# Patient Record
Sex: Female | Born: 2000
Health system: Southern US, Community
[De-identification: ages and names within clinical notes are randomized; demographics above are authoritative.]

## PROBLEM LIST (undated history)

## (undated) DIAGNOSIS — R102 Pelvic and perineal pain: Secondary | ICD-10-CM

## (undated) DIAGNOSIS — F419 Anxiety disorder, unspecified: Secondary | ICD-10-CM

## (undated) DIAGNOSIS — Z973 Presence of spectacles and contact lenses: Secondary | ICD-10-CM

## (undated) DIAGNOSIS — L709 Acne, unspecified: Secondary | ICD-10-CM

## (undated) HISTORY — DX: Acne, unspecified: L70.9

---
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
## 2021-01-10 ENCOUNTER — Other Ambulatory Visit (HOSPITAL_COMMUNITY): Payer: Self-pay

## 2021-01-11 ENCOUNTER — Other Ambulatory Visit (HOSPITAL_COMMUNITY): Payer: Self-pay

## 2021-01-11 DIAGNOSIS — L239 Allergic contact dermatitis, unspecified cause: Secondary | ICD-10-CM | POA: Diagnosis not present

## 2021-01-11 DIAGNOSIS — Z0000 Encounter for general adult medical examination without abnormal findings: Secondary | ICD-10-CM | POA: Diagnosis not present

## 2021-01-11 DIAGNOSIS — L700 Acne vulgaris: Secondary | ICD-10-CM | POA: Diagnosis not present

## 2021-01-11 DIAGNOSIS — F419 Anxiety disorder, unspecified: Secondary | ICD-10-CM | POA: Diagnosis not present

## 2021-01-11 DIAGNOSIS — Z23 Encounter for immunization: Secondary | ICD-10-CM | POA: Diagnosis not present

## 2021-01-11 MED ORDER — ESCITALOPRAM OXALATE 10 MG PO TABS
10.0000 mg | ORAL_TABLET | Freq: Every day | ORAL | 1 refills | Status: DC
  Filled 2021-01-11: qty 30, 30d supply, fill #0
  Filled 2021-01-21: qty 30, 30d supply, fill #1

## 2021-01-12 DIAGNOSIS — Z130 Encounter for screening for diseases of the blood and blood-forming organs and certain disorders involving the immune mechanism: Secondary | ICD-10-CM | POA: Diagnosis not present

## 2021-01-16 DIAGNOSIS — F4322 Adjustment disorder with anxiety: Secondary | ICD-10-CM | POA: Diagnosis not present

## 2021-01-22 ENCOUNTER — Other Ambulatory Visit (HOSPITAL_COMMUNITY): Payer: Self-pay

## 2021-01-22 MED ORDER — ESCITALOPRAM OXALATE 10 MG PO TABS
10.0000 mg | ORAL_TABLET | Freq: Every day | ORAL | 1 refills | Status: DC
  Filled 2021-01-22: qty 30, 30d supply, fill #0

## 2021-01-23 ENCOUNTER — Other Ambulatory Visit (HOSPITAL_COMMUNITY): Payer: Self-pay

## 2021-01-30 ENCOUNTER — Other Ambulatory Visit (HOSPITAL_COMMUNITY): Payer: Self-pay

## 2021-03-01 ENCOUNTER — Encounter (HOSPITAL_BASED_OUTPATIENT_CLINIC_OR_DEPARTMENT_OTHER): Payer: Self-pay

## 2021-03-02 DIAGNOSIS — R1900 Intra-abdominal and pelvic swelling, mass and lump, unspecified site: Secondary | ICD-10-CM | POA: Diagnosis not present

## 2021-03-02 DIAGNOSIS — R1904 Left lower quadrant abdominal swelling, mass and lump: Secondary | ICD-10-CM | POA: Diagnosis not present

## 2021-03-05 ENCOUNTER — Other Ambulatory Visit (HOSPITAL_BASED_OUTPATIENT_CLINIC_OR_DEPARTMENT_OTHER): Payer: Self-pay | Admitting: Obstetrics & Gynecology

## 2021-03-05 ENCOUNTER — Other Ambulatory Visit (HOSPITAL_COMMUNITY): Payer: Self-pay

## 2021-03-05 DIAGNOSIS — R1031 Right lower quadrant pain: Secondary | ICD-10-CM

## 2021-03-05 MED ORDER — LO LOESTRIN FE 1 MG-10 MCG / 10 MCG PO TABS: 1.0000 | ORAL_TABLET | Freq: Every day | ORAL | 9 refills | Status: DC

## 2021-03-06 ENCOUNTER — Other Ambulatory Visit (HOSPITAL_COMMUNITY): Payer: Self-pay

## 2021-03-06 ENCOUNTER — Other Ambulatory Visit (HOSPITAL_BASED_OUTPATIENT_CLINIC_OR_DEPARTMENT_OTHER): Payer: Self-pay | Admitting: Obstetrics & Gynecology

## 2021-03-06 DIAGNOSIS — R1031 Right lower quadrant pain: Secondary | ICD-10-CM

## 2021-03-06 MED ORDER — NORETHIN-ETH ESTRAD-FE BIPHAS 1 MG-10 MCG / 10 MCG PO TABS
1.0000 | ORAL_TABLET | Freq: Every day | ORAL | 2 refills | Status: DC
  Filled 2021-03-06: qty 84, 84d supply, fill #0

## 2021-03-07 ENCOUNTER — Other Ambulatory Visit (HOSPITAL_COMMUNITY): Payer: Self-pay

## 2021-03-07 ENCOUNTER — Encounter (HOSPITAL_BASED_OUTPATIENT_CLINIC_OR_DEPARTMENT_OTHER): Payer: Self-pay

## 2021-03-07 DIAGNOSIS — F339 Major depressive disorder, recurrent, unspecified: Secondary | ICD-10-CM | POA: Insufficient documentation

## 2021-03-07 MED ORDER — ESCITALOPRAM OXALATE 10 MG PO TABS
10.0000 mg | ORAL_TABLET | Freq: Every day | ORAL | 1 refills | Status: DC
  Filled 2021-03-07: qty 90, 90d supply, fill #0

## 2021-03-08 ENCOUNTER — Other Ambulatory Visit (HOSPITAL_BASED_OUTPATIENT_CLINIC_OR_DEPARTMENT_OTHER): Payer: Self-pay | Admitting: Obstetrics & Gynecology

## 2021-03-08 DIAGNOSIS — R1032 Left lower quadrant pain: Secondary | ICD-10-CM

## 2021-03-08 NOTE — Progress Notes (Signed)
Pelvic MRI order placed.

## 2021-03-14 NOTE — Addendum Note (Signed)
Addended by: Blenda Nicely on: 03/14/2021 03:51 PM   Modules accepted: Orders

## 2021-03-15 ENCOUNTER — Encounter (HOSPITAL_BASED_OUTPATIENT_CLINIC_OR_DEPARTMENT_OTHER): Payer: Self-pay | Admitting: *Deleted

## 2021-03-21 ENCOUNTER — Ambulatory Visit (HOSPITAL_COMMUNITY)
Admission: RE | Admit: 2021-03-21 | Discharge: 2021-03-21 | Disposition: A | Discharge status: Home / Self Care | Payer: 59 | Source: Ambulatory Visit | Attending: Obstetrics & Gynecology | Admitting: Obstetrics & Gynecology

## 2021-03-21 ENCOUNTER — Other Ambulatory Visit: Payer: Self-pay

## 2021-03-21 DIAGNOSIS — R1032 Left lower quadrant pain: Secondary | ICD-10-CM | POA: Diagnosis not present

## 2021-03-21 DIAGNOSIS — N83202 Unspecified ovarian cyst, left side: Secondary | ICD-10-CM | POA: Diagnosis not present

## 2021-03-21 IMAGING — MR MR PELVIS WO/W CM
25 of 53 series · 25 of 53 positions shown · IV contrast (Contrast agent)
Comparison: None.

CLINICAL DATA: Left lower quadrant pain.

EXAM:
MRI PELVIS WITHOUT AND WITH CONTRAST
TECHNIQUE: Multiplanar multisequence MR imaging of the pelvis was performed
both before and after administration of intravenous contrast.
CONTRAST:  7.5mL MULTIHANCE GADOBENATE DIMEGLUMINE 529 MG/ML IV SOLN

[Series 2: T2 · coronal · 5.0mm · 1.41mm/px · 1 of 32 slices shown (1 of 2)]
[im 1/32]
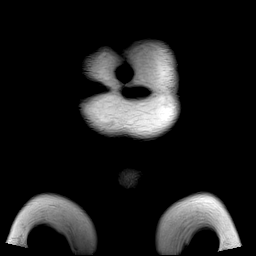

[Series 3: ax tse trig · axial · 5.0mm · 0.81mm/px · 1 of 37 slices shown]
[im 1/37]
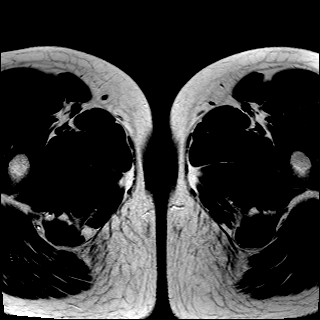

[Series 4: ax tse fs · axial · 5.0mm · 0.81mm/px · 1 of 37 slices shown]
[im 1/37]
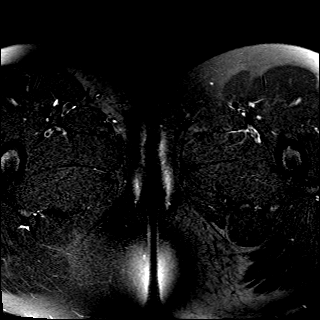

[Series 5: sag tse trig · sagittal · 5.0mm · 0.75mm/px · 1 of 40 slices shown]
[im 1/40]
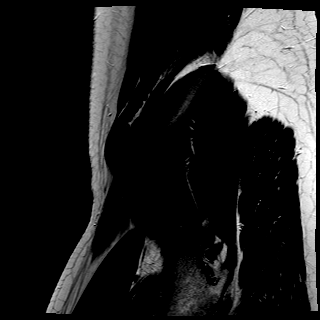

[Series 6: T1 · axial · 5.0mm · 0.74mm/px · 1 of 37 slices shown]
[im 1/37]
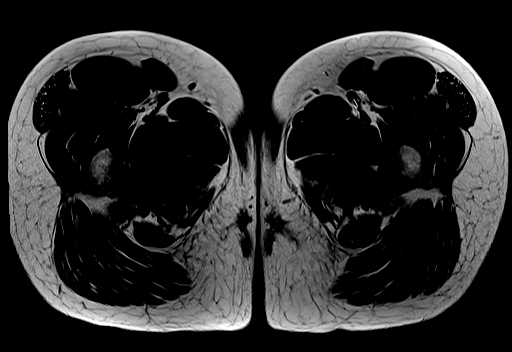

[Series 7: T1 dynamic fat-sat · axial · 1.4mm · 0.59mm/px · 1 of 160 slices shown (1 of 2)]
[im 1/160]
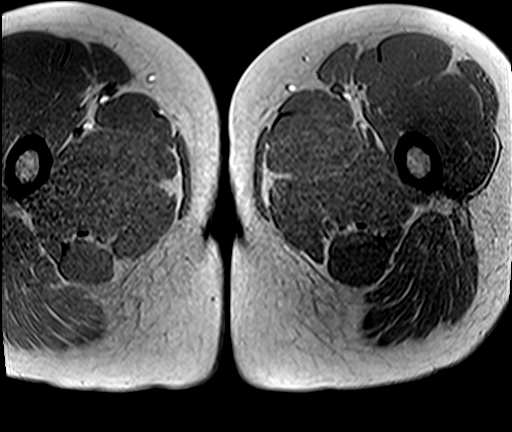

[Series 7: T1 dynamic fat-sat · axial · 1.4mm · 0.59mm/px · 1 of 160 slices shown (2 of 2)]
[im 1/160]
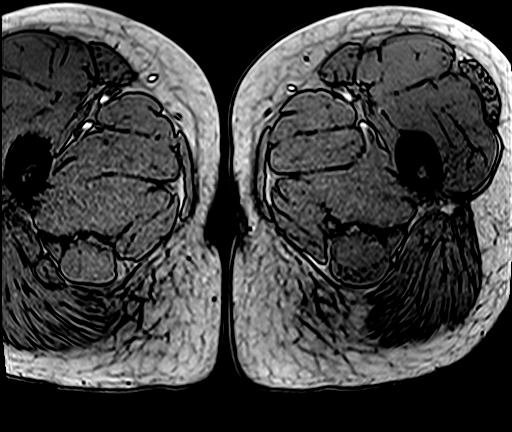

[Series 8: T2 · coronal · 4.0mm · 0.59mm/px · 1 of 38 slices shown (2 of 2)]
[im 1/38]
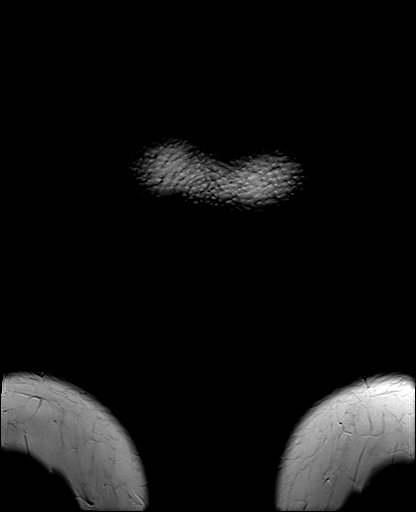

[Series 9: T1 dynamic · axial · 3.9mm · 0.55mm/px · 1 of 56 slices shown (1 of 17)]
[im 1/56]
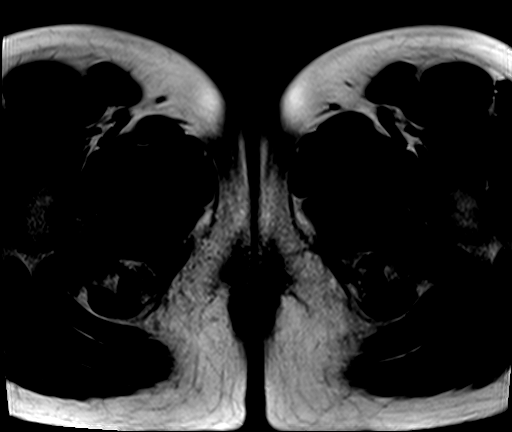

[Series 10: T1 dynamic · axial · 3.9mm · 0.55mm/px · 1 of 56 slices shown (2 of 17)]
[im 1/56]
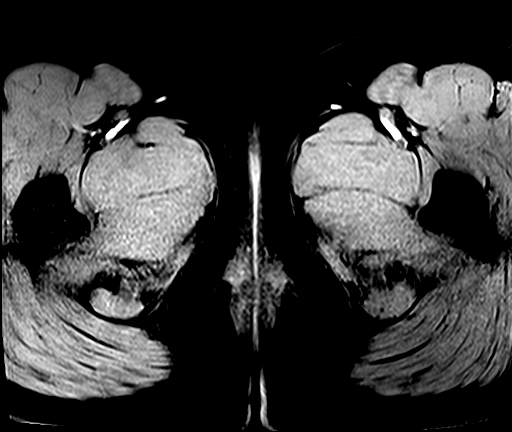

[Series 11: T1 dynamic · axial · 3.9mm · 0.55mm/px · 1 of 56 slices shown (3 of 17)]
[im 1/56]
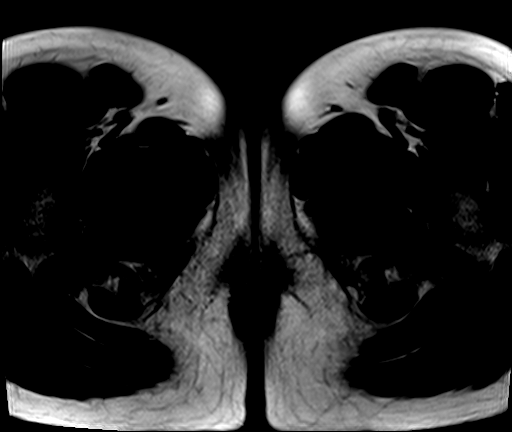

[Series 12: T1 dynamic · axial · 3.9mm · 0.55mm/px · 1 of 56 slices shown (4 of 17)]
[im 1/56]
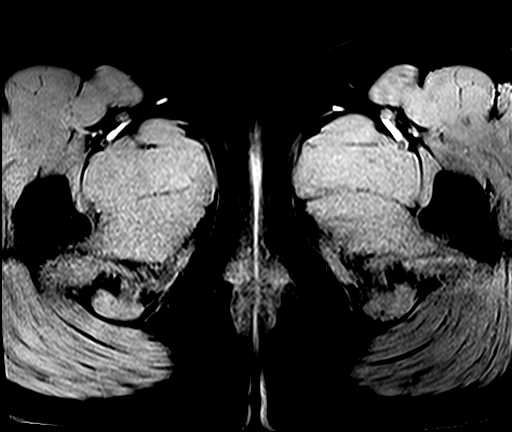

[Series 13: T1 dynamic · axial · 3.9mm · 0.55mm/px · 1 of 56 slices shown (5 of 17)]
[im 1/56]
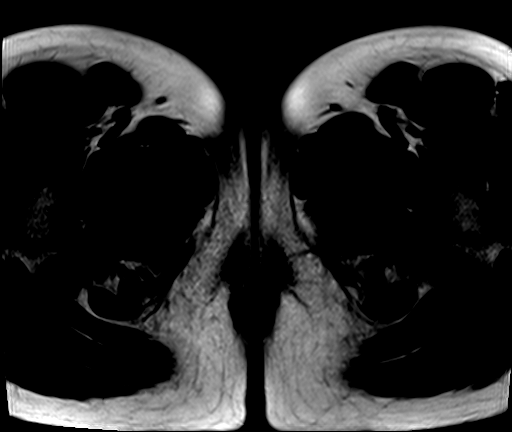

[Series 14: T1 dynamic · axial · 3.9mm · 0.55mm/px · 1 of 56 slices shown (6 of 17)]
[im 1/56]
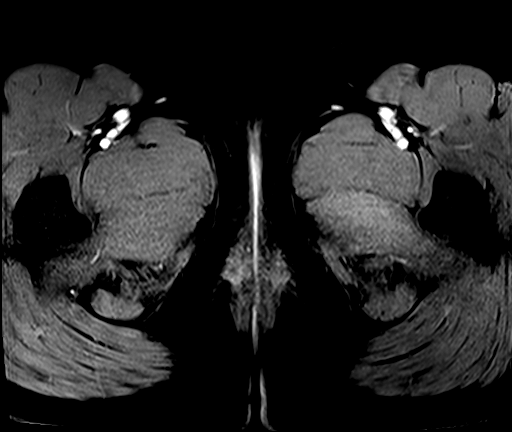

[Series 15: T1 dynamic · axial · 3.9mm · 0.55mm/px · 1 of 56 slices shown (7 of 17)]
[im 1/56]
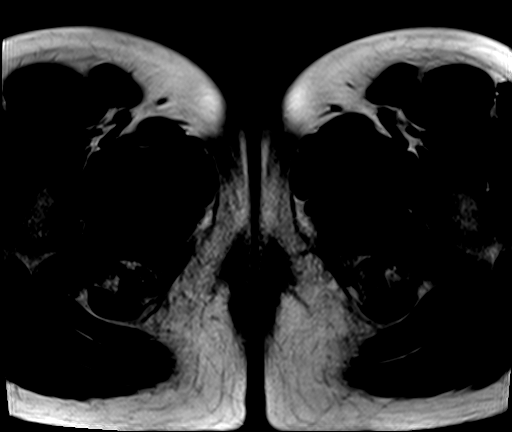

[Series 16: T1 dynamic · axial · 3.9mm · 0.55mm/px · 1 of 56 slices shown (8 of 17)]
[im 1/56]
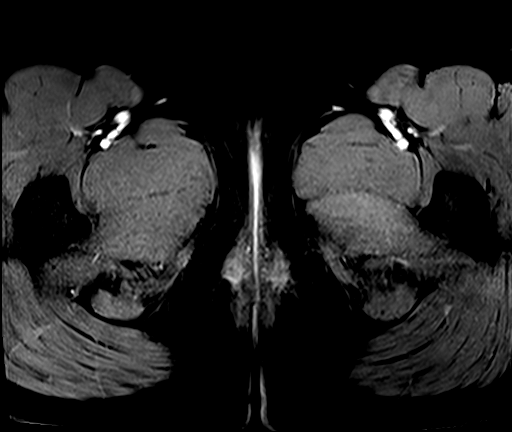

[Series 17: T1 dynamic · axial · 3.9mm · 0.55mm/px · 1 of 56 slices shown (9 of 17)]
[im 1/56]
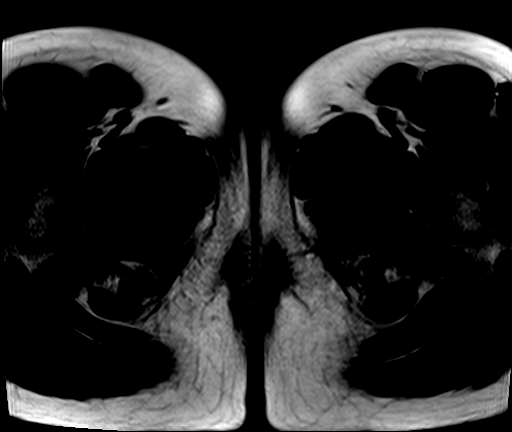

[Series 18: T1 dynamic · axial · 3.9mm · 0.55mm/px · 1 of 56 slices shown (10 of 17)]
[im 1/56]
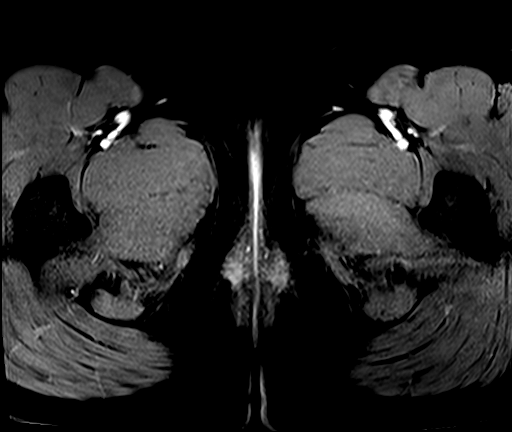

[Series 19: T1 dynamic · axial · 3.9mm · 0.55mm/px · 1 of 56 slices shown (11 of 17)]
[im 1/56]
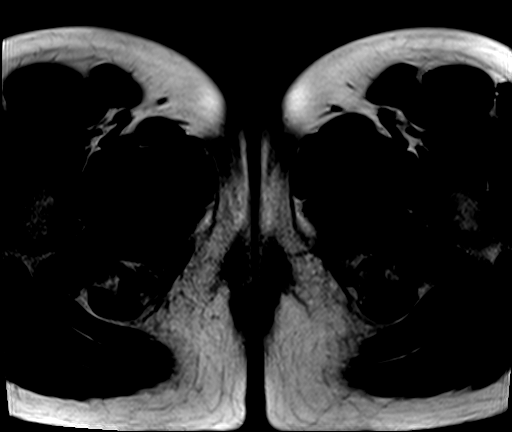

[Series 20: T1 dynamic · axial · 3.9mm · 0.55mm/px · 1 of 56 slices shown (12 of 17)]
[im 1/56]
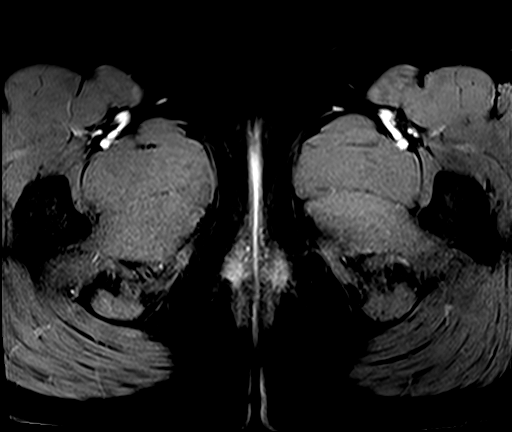

[Series 21: T1 dynamic · axial · 3.9mm · 0.55mm/px · 1 of 56 slices shown (13 of 17)]
[im 1/56]
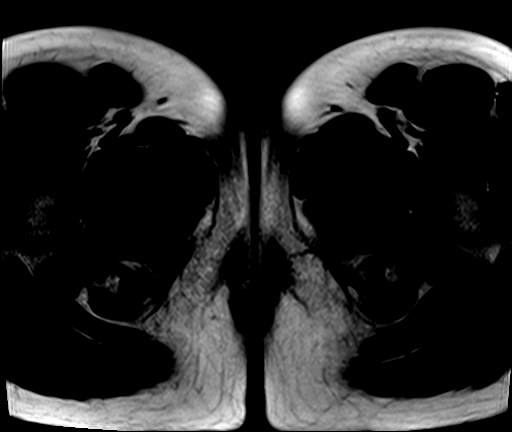

[Series 22: T1 dynamic · axial · 3.9mm · 0.55mm/px · 1 of 56 slices shown (14 of 17)]
[im 1/56]
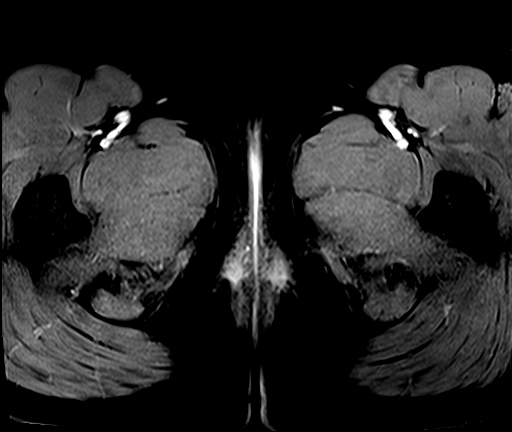

[Series 23: T1 dynamic · axial · 3.9mm · 0.55mm/px · 1 of 56 slices shown (15 of 17)]
[im 1/56]
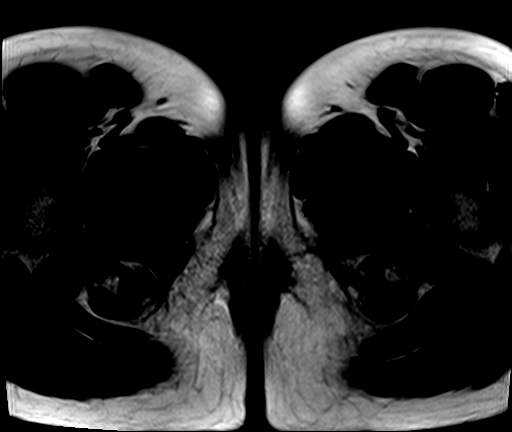

[Series 24: T1 dynamic · axial · 3.9mm · 0.55mm/px · 1 of 56 slices shown (16 of 17)]
[im 1/56]
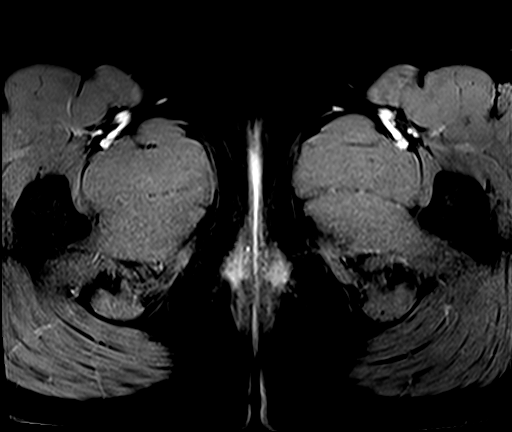

[Series 25: T1 dynamic · axial · 3.9mm · 0.55mm/px · 1 of 56 slices shown (17 of 17)]
[im 1/56]
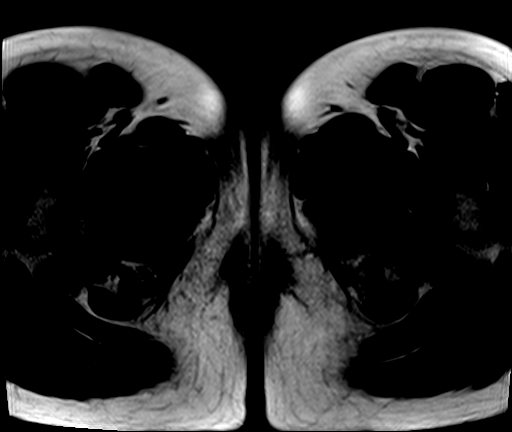

[25 of 53 positions shown; findings below may reference images not displayed]

FINDINGS: Lower Urinary Tract: No urinary bladder or urethral abnormality
identified.

Bowel: Unremarkable pelvic bowel loops.

Vascular/Lymphatic: Unremarkable. No pathologically enlarged pelvic
lymph nodes identified.

Reproductive:

-- Uterus: Measures 7.1 x 3.4 by 4.6 cm (volume = 58 cm^3).
Retroflexed. No fibroids or other masses identified. Cervix and
vagina are unremarkable.

-- Right ovary: Appears normal. No ovarian or adnexal masses
identified.

-- Left ovary: 2.6 cm simple, benign appearing left ovarian cyst. No
ovarian or adnexal masses identified.

Other: Tiny amount of free fluid in pelvic cul-de-sac. No evidence
of mass in the visualized portion of the abdominal wall soft
tissues.

Musculoskeletal:  Unremarkable.
IMPRESSION: 2.6 cm left ovarian simple-appearing cyst, most likely physiologic
in a reproductive age female. No follow-up imaging is recommended.
Reference: JACR [DATE]):[id number]

No other significant abnormality identified.

## 2021-03-21 MED ORDER — GADOBENATE DIMEGLUMINE 529 MG/ML IV SOLN
7.5000 mL | Freq: Once | INTRAVENOUS | Status: AC | PRN
  Administered 2021-03-21: 7.5 mL via INTRAVENOUS

## 2021-03-23 ENCOUNTER — Encounter (HOSPITAL_BASED_OUTPATIENT_CLINIC_OR_DEPARTMENT_OTHER): Payer: Self-pay

## 2021-05-01 ENCOUNTER — Other Ambulatory Visit (HOSPITAL_COMMUNITY): Payer: Self-pay

## 2021-05-01 MED ORDER — ESCITALOPRAM OXALATE 10 MG PO TABS
ORAL_TABLET | ORAL | 0 refills | Status: DC
  Filled 2021-05-07: qty 45, 30d supply, fill #0

## 2021-05-02 ENCOUNTER — Other Ambulatory Visit (HOSPITAL_COMMUNITY): Payer: Self-pay

## 2021-05-07 ENCOUNTER — Other Ambulatory Visit (HOSPITAL_COMMUNITY): Payer: Self-pay

## 2021-05-09 ENCOUNTER — Encounter (HOSPITAL_BASED_OUTPATIENT_CLINIC_OR_DEPARTMENT_OTHER): Payer: Self-pay | Admitting: Obstetrics & Gynecology

## 2021-05-09 ENCOUNTER — Other Ambulatory Visit: Payer: Self-pay

## 2021-05-09 ENCOUNTER — Ambulatory Visit (HOSPITAL_BASED_OUTPATIENT_CLINIC_OR_DEPARTMENT_OTHER): Payer: 59 | Admitting: Obstetrics & Gynecology

## 2021-05-09 VITALS — BP 117/75 | HR 65 | Ht 65.0 in | Wt 164.2 lb

## 2021-05-09 DIAGNOSIS — R1032 Left lower quadrant pain: Secondary | ICD-10-CM | POA: Diagnosis not present

## 2021-05-09 DIAGNOSIS — N926 Irregular menstruation, unspecified: Secondary | ICD-10-CM | POA: Diagnosis not present

## 2021-05-09 NOTE — Progress Notes (Signed)
GYNECOLOGY  VISIT  CC:   follow up after starting OCPs  HPI: 20 y.o. G0P0000 Single White or Caucasian female here for recheck after starting OCPs.  She reports she feels really good with this current pill.  However, she is having a three day cycle every two week.  She is not missing pills.  Bleeding has improved but is not only with placebo pills.  Denies new headaches, nausea, visual changes.  She continues to have LLQ pain.  Worse with certain activities.  Does bother her around her cycle as well.  Has lipoma remove with Dr. Donne Hazel.    Not SA.  Pt's mother accompanies her today.  Patient Active Problem List   Diagnosis Date Noted   LLQ pain 11/02/2020   Acne    Neck pain 08/17/2019   Reactive lymphadenopathy 05/28/2019   Abnormal thyroid function test 02/05/2018   Fatigue 02/05/2018    Past Medical History:  Diagnosis Date   Acne     Past Surgical History:  Procedure Laterality Date   MASS EXCISION N/A 08/03/2020   Procedure: EXCISION ABDOMINAL WALL MASS;  Surgeon: Rolm Bookbinder, MD;  Location: [facility];  Service: General;  Laterality: N/A;    MEDS:   Current Outpatient Medications on File Prior to Visit  Medication Sig Dispense Refill   Cholecalciferol (VITAMIN D3) 50 MCG (2000 UT) CAPS Take by mouth.     COVID-19 At Home Antigen Test [facility] COVID-19 HOME TEST) KIT Use as directed 4 each 0   escitalopram (LEXAPRO) 10 MG tablet Take 1 and 1/2 tablet by mouth once daily. 45 tablet 0   Multiple Vitamin (MULTIVITAMIN) tablet Take 1 tablet by mouth daily.     Norethindrone-Ethinyl Estradiol-Fe Biphas (LO LOESTRIN FE) 1 MG-10 MCG / 10 MCG tablet Take 1 tablet by mouth daily. 28 tablet 9   baclofen (LIORESAL) 10 MG tablet Take one tablet by mouth twice daily as needed. Limit 1-2 treatments per week (Patient not taking: Reported on 05/09/2021) 10 tablet 0   zonisamide (ZONEGRAN) 25 MG capsule Take 4 capsules by mouth each night (Patient not taking:  Reported on 05/09/2021) 120 capsule 1   No current facility-administered medications on file prior to visit.    ALLERGIES: Patient has no known allergies.  Family History  Problem Relation Age of Onset   Endometriosis Mother    Thyroid disease Maternal Grandmother    Heart disease Paternal Grandfather    Thyroid disease Other     SH:  single, non smoker  Review of Systems  Constitutional: Negative.   Gastrointestinal:  Positive for abdominal pain (LLQ pain).   PHYSICAL EXAMINATION:    BP 117/75 (BP Location: Right Arm, Patient Position: Sitting, Cuff Size: Large)   Pulse 65   Ht _0  (1.651 m) Comment: reported  Wt 164 lb 3.2 oz (74.5 kg)   BMI 27.32 kg/m     General appearance: alert, cooperative and appears stated age Abdomen: soft, superficial tenderness to palpation right along area of prior incision; bowel sounds normal; no masses,  no organomegaly Lymph:  no inguinal LAD noted  Assessment/Plan: 1. LLQ pain - pain is so superficial, I do think scar tissue may be part of her issue.  D/w pt and mother PT, scar massage to help.  Does not need referral as mother has contacts within [facility] for this is pt agrees. - we did discuss possible laparoscopy for future evaluation.  As pain is so very focal and superficial, feel this can  wait at this time  2. Irregular periods/menstrual cycles - has experienced side effects on 75mg dosage.  We discussed nuva ring.  Pt has no interest in this.  If irregular bleeding continues, may need to explore other 247m dosage OCPs again.  Of note, this does not seem to have made any difference with LLQ pain which also decreases possibility that pain is caused by endometriosis - she will let me know if desires to switch pill after completion of current pack  Total time with pt and documentation:  24 minutes

## 2021-05-17 ENCOUNTER — Other Ambulatory Visit (HOSPITAL_COMMUNITY): Payer: Self-pay

## 2021-05-17 ENCOUNTER — Encounter (HOSPITAL_BASED_OUTPATIENT_CLINIC_OR_DEPARTMENT_OTHER): Payer: Self-pay | Admitting: Obstetrics & Gynecology

## 2021-05-17 ENCOUNTER — Other Ambulatory Visit (HOSPITAL_BASED_OUTPATIENT_CLINIC_OR_DEPARTMENT_OTHER): Payer: Self-pay | Admitting: Obstetrics & Gynecology

## 2021-05-17 MED ORDER — LO LOESTRIN FE 1 MG-10 MCG / 10 MCG PO TABS
1.0000 | ORAL_TABLET | Freq: Every day | ORAL | 2 refills | Status: DC
  Filled 2021-05-17: qty 84, 84d supply, fill #0

## 2021-05-29 DIAGNOSIS — F4322 Adjustment disorder with anxiety: Secondary | ICD-10-CM | POA: Diagnosis not present

## 2021-05-30 DIAGNOSIS — F4322 Adjustment disorder with anxiety: Secondary | ICD-10-CM | POA: Diagnosis not present

## 2021-06-03 ENCOUNTER — Encounter (HOSPITAL_BASED_OUTPATIENT_CLINIC_OR_DEPARTMENT_OTHER): Payer: Self-pay | Admitting: Obstetrics & Gynecology

## 2021-06-06 ENCOUNTER — Other Ambulatory Visit: Payer: Self-pay

## 2021-06-06 ENCOUNTER — Other Ambulatory Visit (HOSPITAL_COMMUNITY)
Admission: RE | Admit: 2021-06-06 | Discharge: 2021-06-06 | Disposition: A | Discharge status: Home / Self Care | Payer: 59 | Source: Ambulatory Visit | Attending: Obstetrics & Gynecology | Admitting: Obstetrics & Gynecology

## 2021-06-06 ENCOUNTER — Encounter (HOSPITAL_BASED_OUTPATIENT_CLINIC_OR_DEPARTMENT_OTHER): Payer: Self-pay | Admitting: Obstetrics & Gynecology

## 2021-06-06 ENCOUNTER — Ambulatory Visit (INDEPENDENT_AMBULATORY_CARE_PROVIDER_SITE_OTHER): Payer: 59 | Admitting: Obstetrics & Gynecology

## 2021-06-06 VITALS — BP 113/73 | HR 65 | Ht 65.0 in | Wt 163.8 lb

## 2021-06-06 DIAGNOSIS — Z0000 Encounter for general adult medical examination without abnormal findings: Secondary | ICD-10-CM | POA: Diagnosis not present

## 2021-06-06 DIAGNOSIS — R5383 Other fatigue: Secondary | ICD-10-CM

## 2021-06-06 DIAGNOSIS — M6289 Other specified disorders of muscle: Secondary | ICD-10-CM

## 2021-06-06 DIAGNOSIS — N898 Other specified noninflammatory disorders of vagina: Secondary | ICD-10-CM

## 2021-06-06 DIAGNOSIS — N926 Irregular menstruation, unspecified: Secondary | ICD-10-CM | POA: Diagnosis not present

## 2021-06-06 DIAGNOSIS — Z01419 Encounter for gynecological examination (general) (routine) without abnormal findings: Secondary | ICD-10-CM | POA: Diagnosis not present

## 2021-06-06 DIAGNOSIS — R1032 Left lower quadrant pain: Secondary | ICD-10-CM

## 2021-06-06 NOTE — Progress Notes (Addendum)
20 y.o. G0P0000 Single White or Caucasian female here for annual exam.  She does have a boyfriend and does have pain with digital insertion.  She is not currently SA.   ° °Does continue to have LLQ pain.  Has done some dry needling.  This did not help. ° °We continue to discuss possibility of laparoscopy for more evaluation of endometriosis.  She and mother are considering.   ° °On OCPs.  Does have some irregular spotting.  Pt doing well with hormonal dosing.  Desires to continue. °         °Sexually active: not having intercourse °The current method of family planning is OCP (estrogen/progesterone).    °Exercising: Yes.    Plays field hockey in college °Smoker:  no ° ° reports that she has never smoked. She has never used smokeless tobacco. She reports that she does not drink alcohol and does not use drugs. ° °Past Medical History:  °Diagnosis Date  ° Acne   ° ° °Past Surgical History:  °Procedure Laterality Date  ° MASS EXCISION N/A 08/03/2020  ° Procedure: EXCISION ABDOMINAL WALL MASS;  Surgeon: Wakefield, Matthew, MD;  Location: [facility];  Service: General;  Laterality: N/A;  ° ° °Current Outpatient Medications  °Medication Sig Dispense Refill  ° Cholecalciferol (VITAMIN D3) 50 MCG (2000 UT) CAPS Take by mouth.    ° COVID-19 At Home Antigen Test (CARESTART COVID-19 HOME TEST) KIT Use as directed 4 each 0  ° escitalopram (LEXAPRO) 10 MG tablet Take 1 and 1/2 tablet by mouth once daily. 45 tablet 0  ° Multiple Vitamin (MULTIVITAMIN) tablet Take 1 tablet by mouth daily.    ° Norethindrone-Ethinyl Estradiol-Fe Biphas (LO LOESTRIN FE) 1 MG-10 MCG / 10 MCG tablet Take 1 tablet by mouth daily. 84 tablet 2  ° baclofen (LIORESAL) 10 MG tablet Take one tablet by mouth twice daily as needed. Limit 1-2 treatments per week (Patient not taking: Reported on 05/09/2021) 10 tablet 0  ° zonisamide (ZONEGRAN) 25 MG capsule Take 4 capsules by mouth each night (Patient not taking: Reported on 05/09/2021) 120 capsule  1  ° °No current facility-administered medications for this visit.  ° ° °Family History  °Problem Relation Age of Onset  ° Endometriosis Mother   ° Thyroid disease Maternal Grandmother   ° Heart disease Paternal Grandfather   ° Thyroid disease Other   ° ° °Review of Systems  °Genitourinary:   °     LLQ pain  ° °Exam:   °BP 113/73 (BP Location: Right Arm, Patient Position: Sitting, Cuff Size: Large)    Pulse 65    Ht 5' 5" (1.651 m) Comment: reported   Wt 163 lb 12.8 oz (74.3 kg)    LMP 05/21/2021 Comment: A little bit of bleeding   BMI 27.26 kg/m²   Height: 5' 5" (165.1 cm) (reported) ° °General appearance: alert, cooperative and appears stated age °Head: Normocephalic, without obvious abnormality, atraumatic °Abdomen: soft, mild superficial LLQ pain; bowel sounds normal; no masses,  no organomegaly °Extremities: extremities normal, atraumatic, no cyanosis or edema °Skin: Skin color, texture, turgor normal. No rashes or lesions °Lymph nodes: Cervical, supraclavicular, and axillary nodes normal. °No abnormal inguinal nodes palpated °Neurologic: Grossly normal ° °Pelvic: External genitalia:  erythema at introitus esp at 7 o'clock °             Urethra:  normal appearing urethra with no masses, tenderness or lesions °               Bartholins and Skenes: normal    °             Vagina: normal appearing vagina with normal color and no discharge, no lesions °             Cervix: no lesions °             Pap taken: No. °Bimanual Exam:  Uterus:  normal size, contour, position, consistency, mobility, non-tender °             Adnexa:  tenderness at left adnexa to deeper palpation °             Tenderness to palpation of pelvic floor ° °Chaperone, Tonya , CMA, was present for exam. ° °Assessment/Plan: °1. Well woman exam with routine gynecological exam °- pap not indicated.  As has not had intercourse, GC/Chl screening not indicated °- will start MMG/colon cancer screening later according to guidelines ° °2. Vaginal  irritation °- Cervicovaginal ancillary only( [place]) °- negative will start topical vaginal estrogen cream ° °3. Other fatigue °- CBC °- TSH °- VITAMIN D 25 Hydroxy (Vit-D Deficiency, Fractures) °- Comprehensive metabolic panel °- Ferritin ° °4. LLQ pain °- will proceed with laparoscopy when pt is ready ° °5. Irregular periods/menstrual cycles °- improved with Loloestrin.  Does not need rx at this time. ° ° °

## 2021-06-07 ENCOUNTER — Encounter: Payer: 59 | Attending: Obstetrics & Gynecology | Admitting: Physical Therapy

## 2021-06-07 ENCOUNTER — Encounter (HOSPITAL_BASED_OUTPATIENT_CLINIC_OR_DEPARTMENT_OTHER): Payer: Self-pay | Admitting: Obstetrics & Gynecology

## 2021-06-07 ENCOUNTER — Encounter: Payer: Self-pay | Admitting: Physical Therapy

## 2021-06-07 DIAGNOSIS — M6289 Other specified disorders of muscle: Secondary | ICD-10-CM | POA: Diagnosis not present

## 2021-06-07 DIAGNOSIS — R252 Cramp and spasm: Secondary | ICD-10-CM | POA: Insufficient documentation

## 2021-06-07 DIAGNOSIS — M6281 Muscle weakness (generalized): Secondary | ICD-10-CM | POA: Diagnosis not present

## 2021-06-07 LAB — COMPREHENSIVE METABOLIC PANEL
ALT: 14 IU/L (ref 0–32)
AST: 19 IU/L (ref 0–40)
Albumin/Globulin Ratio: 1.7 (ref 1.2–2.2)
Albumin: 4.7 g/dL (ref 3.9–5.0)
Alkaline Phosphatase: 66 IU/L (ref 42–106)
BUN/Creatinine Ratio: 16 (ref 9–23)
BUN: 12 mg/dL (ref 6–20)
Bilirubin Total: <0.2 mg/dL (ref 0.0–1.2)
CO2: 21 mmol/L (ref 20–29)
Calcium: 9.5 mg/dL (ref 8.7–10.2)
Chloride: 102 mmol/L (ref 96–106)
Creatinine, Ser: 0.74 mg/dL (ref 0.57–1.00)
Globulin, Total: 2.7 g/dL (ref 1.5–4.5)
Glucose: 80 mg/dL (ref 70–99)
Potassium: 4.2 mmol/L (ref 3.5–5.2)
Sodium: 139 mmol/L (ref 134–144)
Total Protein: 7.4 g/dL (ref 6.0–8.5)
eGFR: 119 mL/min/{1.73_m2} (ref >59)

## 2021-06-07 LAB — CBC
Hematocrit: 40.3 % (ref 34.0–46.6)
Hemoglobin: 13.4 g/dL (ref 11.1–15.9)
MCH: 27.5 pg (ref 26.6–33.0)
MCHC: 33.3 g/dL (ref 31.5–35.7)
MCV: 83 fL (ref 79–97)
Platelets: 301 10*3/uL (ref 150–450)
RBC: 4.88 x10E6/uL (ref 3.77–5.28)
RDW: 12.6 % (ref 11.7–15.4)
WBC: 8.8 10*3/uL (ref 3.4–10.8)

## 2021-06-07 LAB — CERVICOVAGINAL ANCILLARY ONLY
Bacterial Vaginitis (gardnerella): NEGATIVE
Candida Glabrata: NEGATIVE
Candida Vaginitis: NEGATIVE
Comment: NEGATIVE
Comment: NEGATIVE
Comment: NEGATIVE

## 2021-06-07 LAB — VITAMIN D 25 HYDROXY (VIT D DEFICIENCY, FRACTURES): Vit D, 25-Hydroxy: 57 ng/mL (ref 30.0–100.0)

## 2021-06-07 LAB — TSH: TSH: 0.599 u[IU]/mL (ref 0.450–4.500)

## 2021-06-07 LAB — FERRITIN: Ferritin: 52 ng/mL (ref 15–77)

## 2021-06-07 MED ORDER — BLOOD PRESSURE KIT DEVI: 1.0000 | Freq: Once | 0 refills | Status: DC

## 2021-06-07 NOTE — Therapy (Signed)
[place] at [facility] [address], [place], [place], [address] Phone: [phone]   Fax:  [phone]  Physical Therapy Evaluation  Patient Details  Name: Christine Estes MRN: [id number] Date of Birth: Mar 07, 2001 Referring Provider (PT): Dr. Hale Bogus   Encounter Date: 06/07/2021   PT End of Session - 06/07/21 1624     Visit Number 1    Date for PT Re-Evaluation 06/28/21    Authorization Type [facility]    PT Start Time 1500    PT Stop Time 1600    PT Time Calculation (min) 60 min    Activity Tolerance Patient tolerated treatment well;No increased pain    Behavior During Therapy WFL for tasks assessed/performed             Past Medical History:  Diagnosis Date   Acne     Past Surgical History:  Procedure Laterality Date   MASS EXCISION N/A 08/03/2020   Procedure: EXCISION ABDOMINAL WALL MASS;  Surgeon: Rolm Bookbinder, MD;  Location: [place];  Service: General;  Laterality: N/A;    There were no vitals filed for this visit.    Subjective Assessment - 06/07/21 1508     Subjective Patient had her surgery for abdominal hernia 08/03/2020. Patient has severe pain in the right lower quadrant. Pain feels like period cramps. She had a pelvic exam and touched the ovary and was supe tender. Hurst to play sports. Patinet will have increased pain when she runs then stops. Swing the field hocky stick and swing from righ tot left. Sitting in car for 1 hour and get up. No cahnges in bowel mor urine. When the bladder is fullest there is more pain and feel sbetter after urination. No bloating. No cahnges on her cysle. Patient ahs steady bowel movements. WEigthed thrusting and lifting wt. Abdominal curl ups. Feels like pressure against the stomach wall.    Patient Stated Goals reduce pain    Currently in Pain? Yes    Pain Score 7    pain 7-9/10   Pain Location Abdomen    Pain Orientation Left    Pain  Descriptors / Indicators Throbbing;Stabbing;Pressure    Pain Type Chronic pain    Pain Onset More than a month ago    Pain Frequency Intermittent    Aggravating Factors  swing field hockey stick, abdominal ex, sitting proloanged period of time, wieght lifting, bladder is fullest    Pain Relieving Factors putting pressure on the stomach wall    Multiple Pain Sites No                [facility] PT Assessment - 06/07/21 0001       Assessment   Medical Diagnosis M62.89 Pelvic floor dysfunction    Referring Provider (PT) Dr. Hale Bogus    Onset Date/Surgical Date --   had pain for 2 years and surgery did not help   Prior Therapy noen      Precautions   Precautions None      Restrictions   Weight Bearing Restrictions No      Balance Screen   Has the patient fallen in the past 6 months No    Has the patient had a decrease in activity level because of a fear of falling?  No    Is the patient reluctant to leave their home because of a fear of falling?  No      Home Ecologist  residence      Prior Function   Level of Independence Independent    Architect   plays D1 field hockey   Leisure field hockey      Cognition   Overall Cognitive Status Within Functional Limits for tasks assessed      Posture/Postural Control   Posture/Postural Control No significant limitations      ROM / Strength   AROM / PROM / Strength AROM;PROM;Strength      AROM   Overall AROM Comments end randge of lumbar movement is shaky    Lumbar Flexion goes alittle to the right, fascial tightness in L4-5    Lumbar - Left Side Bend decreased by 25%    Lumbar - Left Rotation decreased by 25%      PROM   Right Hip Flexion 112    Left Hip Flexion 110      Strength   Right Hip External Rotation  4/5    Right Hip ABduction 4+/5    Right Hip ADduction 4/5    Left Hip ABduction 4/5    Left Hip ADduction 4/5      Palpation   Spinal mobility cecreased movement of L3-L5     SI assessment  left ilium post. rotated; decreased movement of left SI joint;    Palpation comment tenderness located in left levator ani and left upper abdominals and superior to the left inguinal ligament      Special Tests    Special Tests Sacrolliac Tests      Pelvic Dictraction   Findings Positive    Side  Left    Comment pain      Gaenslen's test   Findings Positive    Side  Left    Comments slight pain, decreased movement                        Objective measurements completed on examination: See above findings.       [facility] Adult PT Treatment/Exercise - 06/07/21 0001       Self-Care   Self-Care Other Self-Care Comments    Other Self-Care Comments  gave patient information on dry needling; educated apteitn on how to perfrom manual work internally on the left pelvic floor      Manual Therapy   Manual Therapy Soft tissue mobilization;Myofascial release;Joint mobilization    Manual therapy comments to assess for dry needling    Joint Mobilization gapping of righ tside fo L2-L4; PA and rotational mobilization of L3-L5; mobilization of left SI joint; anterior glide of bilateral hips    Soft tissue mobilization manual work to the left rectus to elongate after dry needling    Myofascial Release release around the inguinal ligament, along the obturator internist, left lower abdomen in supine and sidely wth left hip movements              Trigger Point Dry Needling - 06/07/21 0001     Consent Given? Yes    Education Handout Provided Yes    Other Dry Needling left rectus in lower portion   trigger point response, elongation of muscle                  PT Education - 06/07/21 1616     Education Details information on dry needling; educated patient on how to perform manual work to the left pelvic floor internally and gave her UBER lube samples ti use for the internal work    [employer])  Educated Patient    Methods  Explanation;Handout;Demonstration    Comprehension Verbalized understanding;Returned demonstration                 PT Long Term Goals - 06/07/21 1636       PT LONG TERM GOAL #1   Title Pt will be ind with advanced HEP for improved LE flexibility, deep core, and sport-specific dynamic stabilization.    Time 3    Period Weeks    Status New    Target Date 06/28/21      PT LONG TERM GOAL #2   Title sitting fo r30 mintues thean stand up with pain level decreased >/= 50%    Time 3    Period Weeks    Status New    Target Date 06/28/21      PT LONG TERM GOAL #3   Title able to run then sto pshort with left lower abdominal pain decreased >/= 50%    Baseline --    Time 3    Period Weeks    Status New    Target Date 06/28/21      PT LONG TERM GOAL #4   Title independent with self muscle release techniques to work on her trigger points    Baseline --    Time 3    Period Weeks    Status New    Target Date 06/28/21      PT LONG TERM GOAL #5   Title --                    Plan - 06/07/21 1625     Clinical Impression Statement Patient is a 20 year old female with pelvic floor dysfunction causing her left lower abdominal pain at level 7-9/10 for the past 2 years. She had abdominal hernia surgery for the pain but has not helped. Pain is worse with sitting long period of time then getting up, running then stopping short, bladder is fullest, abdominal exercise and weight lifting. Lumbar fleixon deviates to the right and fascial tension in the lower lumbar, left sidebend and rotation decreased by 25%. Bilateral hip flexion decreased by 10 degrees. bilateral hip abduction and adduction are weak. Tenderness located in the left lower abdominal area, left diaphragm, left levator ani. Increased fascial tightness in the left lower rectus. Positive SI test on the left for distraction and Gaenslens. test on the left. Patient will benefit from trigger point and fascial release of the  left lower abdominal area including strength of the core and hips to reduce her pain.    Personal Factors and Comorbidities Comorbidity 1;Fitness;Past/Current Experience    Comorbidities hernia repair;    Examination-Activity Limitations Locomotion Level;Lift;Transfers;Sit    Examination-Participation Restrictions Community Activity    Stability/Clinical Decision Making Evolving/Moderate complexity    Clinical Decision Making Low    Rehab Potential Excellent    PT Frequency 2x / week    PT Duration Other (comment)   3 weeks   PT Treatment/Interventions Cryotherapy;Electrical Stimulation;Iontophoresis 4mg /ml Dexamethasone;Moist Heat;Therapeutic activities;Therapeutic exercise;Neuromuscular re-education;Patient/family education;Manual techniques;Scar mobilization;Dry needling;Taping;Spinal Manipulations;Joint Manipulations    PT Next Visit Plan fascial release of the inguinal area, alon gthe left uterus, along the left rectus, Thomas test, diaphragmatic breathing    Consulted and Agree with Plan of Care Patient             Patient will benefit from skilled therapeutic intervention in order to improve the following deficits and impairments:  Decreased endurance, Decreased scar mobility,  Decreased activity tolerance, Decreased strength, Increased fascial restricitons, Pain, Increased muscle spasms, Decreased range of motion, Decreased coordination  Visit Diagnosis: Muscle weakness (generalized) - Plan: PT plan of care cert/re-cert  Cramp and spasm - Plan: PT plan of care cert/re-cert     Problem List Patient Active Problem List   Diagnosis Date Noted   Major depressive disorder, recurrent episode ([place]) 03/07/2021   LLQ pain 11/02/2020   Acne    Neck pain 08/17/2019   Reactive lymphadenopathy 05/28/2019   Abnormal thyroid function test 02/05/2018   Fatigue 02/05/2018    Earlie Counts, PT 06/07/21 4:40 PM  [facility] [address], [place], [place], [address] Phone: [phone]   Fax:  [phone]  Name: Christine Estes MRN: [id number] Date of Birth: 2000-11-03

## 2021-06-07 NOTE — Patient Instructions (Signed)

## 2021-06-07 NOTE — Addendum Note (Signed)
Addended by: Blenda Nicely on: 06/07/2021 08:17 AM   Modules accepted: Orders

## 2021-06-07 NOTE — Addendum Note (Signed)
Addended by: Blenda Nicely on: 06/07/2021 08:18 AM   Modules accepted: Orders

## 2021-06-07 NOTE — Addendum Note (Signed)
Addended by: [name] on: 06/07/2021 01:34 PM   Modules accepted: Orders

## 2021-06-08 ENCOUNTER — Other Ambulatory Visit (HOSPITAL_BASED_OUTPATIENT_CLINIC_OR_DEPARTMENT_OTHER): Payer: Self-pay | Admitting: Obstetrics & Gynecology

## 2021-06-12 ENCOUNTER — Encounter (HOSPITAL_BASED_OUTPATIENT_CLINIC_OR_DEPARTMENT_OTHER): Payer: Self-pay

## 2021-06-15 ENCOUNTER — Encounter: Payer: Self-pay | Admitting: Physical Therapy

## 2021-06-15 ENCOUNTER — Other Ambulatory Visit: Payer: Self-pay

## 2021-06-15 ENCOUNTER — Ambulatory Visit: Payer: 59 | Attending: Pediatrics | Admitting: Physical Therapy

## 2021-06-15 DIAGNOSIS — R252 Cramp and spasm: Secondary | ICD-10-CM | POA: Insufficient documentation

## 2021-06-15 DIAGNOSIS — M25552 Pain in left hip: Secondary | ICD-10-CM | POA: Insufficient documentation

## 2021-06-15 DIAGNOSIS — M6281 Muscle weakness (generalized): Secondary | ICD-10-CM | POA: Diagnosis present

## 2021-06-15 NOTE — Patient Instructions (Signed)
Access Code: [facility] URL: [email or web address] Date: 06/15/2021 Prepared by: Earlie Counts  Program Notes get a prickly ball and sit on it to massage the pelvic floor muscles worlds greatest stretch and add trunk twist   Exercises Supine Diaphragmatic Breathing - 1 x daily - 7 x weekly - 3 sets - 10 reps Seated Diaphragmatic Breathing - 1 x daily - 7 x weekly - 3 sets - 10 reps Sidelying Reverse Clamshell - 1 x daily - 7 x weekly - 1 sets - 10 reps Pigeon Pose - 1 x daily - 7 x weekly - 2 sets - 2 reps - 30 sec hold [facility] [address], [place], [place] [address] Phone # [phone] Fax [phone]

## 2021-06-15 NOTE — Therapy (Addendum)
[place] @ [facility] [place], [place], [address] Phone: [phone]   Fax:  [phone]  Physical Therapy Treatment  Patient Details  Name: Christine Estes MRN: [id number] Date of Birth: [birth year] Referring Provider (PT): Dr. Hale Bogus   Encounter Date: 06/15/2021   PT End of Session - 06/15/21 1104     Visit Number 2    Date for PT Re-Evaluation 06/28/21    Authorization Type [facility]    PT Start Time 0930    PT Stop Time 1055    PT Time Calculation (min) 85 min    Activity Tolerance Patient tolerated treatment well;No increased pain    Behavior During Therapy WFL for tasks assessed/performed             Past Medical History:  Diagnosis Date   Acne     Past Surgical History:  Procedure Laterality Date   MASS EXCISION N/A 08/03/2020   Procedure: EXCISION ABDOMINAL WALL MASS;  Surgeon: Rolm Bookbinder, MD;  Location: [place];  Service: General;  Laterality: N/A;    There were no vitals filed for this visit.   Subjective Assessment - 06/15/21 0932     Subjective Afterwards my hips hurt for 3 days. The pain is the same and still there.    Patient Stated Goals reduce pain    Currently in Pain? Yes    Pain Score 7     Pain Location Abdomen    Pain Orientation Left    Pain Descriptors / Indicators Pressure;Throbbing;Stabbing    Pain Type Chronic pain    Pain Onset More than a month ago    Pain Frequency Intermittent    Aggravating Factors  swing field hockey stick, abdominal ex, sitting for prolonged period of time, weight lifting, bladder is fullest    Pain Relieving Factors putting pressure on the stomach wall    Multiple Pain Sites No                            Pelvic Floor Special Questions - 06/15/21 0001     Pelvic Floor Internal Exam Patient confirms identification and approves PT to assess pelvic floor and treatment    Exam Type Vaginal    Palpation  trigger points in the obturator internist, and levator ani on the left               [facility] Adult PT Treatment/Exercise - 06/15/21 0001       Self-Care   Self-Care Other Self-Care Comments    Other Self-Care Comments  using a prickly ball to sit on and  roll it along the levator ani muscles, education on pelvic wand to work on the pelvic floor muscles on the left      Neuro Re-ed    Neuro Re-ed Details  diaphragmatic breathing in supine and sidely to open up the diaphragm and pelvic floor      Lumbar Exercises: Stretches   Piriformis Stretch Left;2 reps;30 seconds    Piriformis Stretch Limitations pigeon pose and deep lunge moving the forward knee in and out      Lumbar Exercises: Sidelying   Other Sidelying Lumbar Exercises reverse clam on the left 10x      Manual Therapy   Manual Therapy Soft tissue mobilization;Myofascial release;Internal Pelvic Floor    Manual therapy comments education on how to massage the levator ani muscles laying in sidely  Soft tissue mobilization left gluteal, hip rotators, obliques, and diaphram, left quadricep and hip adductors    Myofascial Release release of the urogenital diaphram; release of the left lower quadrant in right sidely with moving the left hip into extension and abduction    Internal Pelvic Floor manual releases with hip movement to the left levator ani and obturator internist while monitoring for pain                     PT Education - 06/15/21 1059     Education Details Access Code: [email or web address]; education on vaginal wands; eduation on diaphragmatic breathing and how to massage the levator ani in sidley    Person(s) Educated Patient    Methods Explanation;Demonstration;Verbal cues;Handout    Comprehension Returned demonstration;Verbalized understanding                 PT Long Term Goals - 06/07/21 1636       PT LONG TERM GOAL #1   Title Pt will be ind with advanced HEP for improved LE flexibility, deep core,  and sport-specific dynamic stabilization.    Time 3    Period Weeks    Status New    Target Date 06/28/21      PT LONG TERM GOAL #2   Title sitting fo r30 mintues thean stand up with pain level decreased >/= 50%    Time 3    Period Weeks    Status New    Target Date 06/28/21      PT LONG TERM GOAL #3   Title able to run then sto pshort with left lower abdominal pain decreased >/= 50%    Baseline --    Time 3    Period Weeks    Status New    Target Date 06/28/21      PT LONG TERM GOAL #4   Title independent with self muscle release techniques to work on her trigger points    Baseline --    Time 3    Period Weeks    Status New    Target Date 06/28/21      PT LONG TERM GOAL #5   Title --                   Plan - 06/15/21 1158     Clinical Impression Statement Patient had trigger points in the left levator ani, obturator internist, left diaphragm, and left upper abdominals. Patient was able to tolerate internal work to the left levator ani and obturator internist while monitoring for pain. She had alot of fascial releases in the pelvic floor muscles with heat felt in the therapist hand and pusating of the tissue. Patient was able to perfrom pelvic floor drop correctly after she was able to perfrom diaphragmatic breathing. Patient had increased left hip extension after manual work. Patient will benefit from skilled therapy to release trigger points, fascial release of the left lower abdominal area including strength of the core and hips to reduce her pain.    Personal Factors and Comorbidities Comorbidity 1;Fitness;Past/Current Experience    Comorbidities hernia repair;    Examination-Activity Limitations Locomotion Level;Lift;Transfers;Sit    Examination-Participation Restrictions Community Activity    Stability/Clinical Decision Making Evolving/Moderate complexity    Rehab Potential Excellent    PT Frequency 2x / week    PT Duration Other (comment)   3 weeks   PT  Treatment/Interventions Cryotherapy;Electrical Stimulation;Iontophoresis 1m/ml Dexamethasone;Moist Heat;Therapeutic activities;Therapeutic exercise;Neuromuscular re-education;Patient/family education;Manual techniques;Scar mobilization;Dry needling;Taping;Spinal  Manipulations;Joint Manipulations    PT Next Visit Plan see if she has gotten the wand then educate; internal work to the left pelvic floor, see if she has trigger points in the right pelvic floor, check pelvic alignment, see is she has the ball to massage the pelvic floor muscles, basic stabilization exercises    PT Home Exercise Plan Access Code: [facility]    Recommended Other Services MD signed initial summary    Consulted and Agree with Plan of Care Patient             Patient will benefit from skilled therapeutic intervention in order to improve the following deficits and impairments:  Decreased endurance, Decreased scar mobility, Decreased activity tolerance, Decreased strength, Increased fascial restricitons, Pain, Increased muscle spasms, Decreased range of motion, Decreased coordination  Visit Diagnosis: Muscle weakness (generalized)  Cramp and spasm  Pain in left hip     Problem List Patient Active Problem List   Diagnosis Date Noted   Major depressive disorder, recurrent episode ([place]) 03/07/2021   LLQ pain 11/02/2020   Acne    Neck pain 08/17/2019   Reactive lymphadenopathy 05/28/2019   Abnormal thyroid function test 02/05/2018   Fatigue 02/05/2018    Earlie Counts, PT 06/15/21 12:06 PM   [place] @ [facility] [place], [place], [address] Phone: [phone]   Fax:  [phone]  Name: Christine Estes MRN: [id number] Date of Birth: 03/03/2001 PHYSICAL THERAPY DISCHARGE SUMMARY  Visits from Start of Care: 2  Current functional level related to goals / functional outcomes: See above. Patient was continuing to have pain and wanted to be discharged  since she is going back to school. Patient did have increased tenderness located in the left pelvic floor  muscles when internal work was done. After the manual work there was no change in her pain.    Remaining deficits: See above.    Education / Equipment: HEP   Patient agrees to discharge. Patient goals were not met. Patient is being discharged due to the patient's request. Thank you for the referral. Earlie Counts, PT 06/20/21 1:57 PM

## 2021-06-19 DIAGNOSIS — F4322 Adjustment disorder with anxiety: Secondary | ICD-10-CM | POA: Diagnosis not present

## 2021-06-20 ENCOUNTER — Other Ambulatory Visit (HOSPITAL_COMMUNITY): Payer: Self-pay

## 2021-06-20 ENCOUNTER — Encounter (HOSPITAL_BASED_OUTPATIENT_CLINIC_OR_DEPARTMENT_OTHER): Payer: Self-pay | Admitting: Obstetrics & Gynecology

## 2021-06-20 ENCOUNTER — Encounter: Payer: Self-pay | Admitting: Physical Therapy

## 2021-06-22 ENCOUNTER — Encounter: Payer: Self-pay | Admitting: Physical Therapy

## 2021-06-26 ENCOUNTER — Other Ambulatory Visit (HOSPITAL_COMMUNITY): Payer: Self-pay

## 2021-06-26 MED ORDER — ESCITALOPRAM OXALATE 10 MG PO TABS
ORAL_TABLET | ORAL | 1 refills | Status: DC
  Filled 2021-06-26: qty 90, 90d supply, fill #0

## 2021-07-16 ENCOUNTER — Encounter (HOSPITAL_BASED_OUTPATIENT_CLINIC_OR_DEPARTMENT_OTHER): Payer: Self-pay | Admitting: Obstetrics & Gynecology

## 2021-07-16 ENCOUNTER — Other Ambulatory Visit (HOSPITAL_BASED_OUTPATIENT_CLINIC_OR_DEPARTMENT_OTHER): Payer: Self-pay | Admitting: Obstetrics & Gynecology

## 2021-07-16 ENCOUNTER — Other Ambulatory Visit (HOSPITAL_COMMUNITY): Payer: Self-pay

## 2021-07-16 MED ORDER — NORETHIN ACE-ETH ESTRAD-FE 1-20 MG-MCG PO TABS
1.0000 | ORAL_TABLET | Freq: Every day | ORAL | 3 refills | Status: DC
  Filled 2021-07-16: qty 84, 84d supply, fill #0
  Filled 2021-11-03: qty 84, 84d supply, fill #1
  Filled 2022-01-03 – 2022-01-18 (×2): qty 84, 84d supply, fill #2
  Filled 2022-05-05: qty 84, 84d supply, fill #3

## 2021-08-06 ENCOUNTER — Other Ambulatory Visit (HOSPITAL_BASED_OUTPATIENT_CLINIC_OR_DEPARTMENT_OTHER): Payer: Self-pay | Admitting: Obstetrics & Gynecology

## 2021-08-06 DIAGNOSIS — R1032 Left lower quadrant pain: Secondary | ICD-10-CM

## 2021-08-16 ENCOUNTER — Other Ambulatory Visit: Payer: Self-pay

## 2021-08-16 ENCOUNTER — Encounter (HOSPITAL_BASED_OUTPATIENT_CLINIC_OR_DEPARTMENT_OTHER): Payer: Self-pay | Admitting: Obstetrics & Gynecology

## 2021-08-16 NOTE — Progress Notes (Signed)
Spoke w/ via phone for pre-op interview---pt ?Lab needs dos----    cbc urine preg           ?Lab results------none ?COVID test -----patient states asymptomatic no test needed ?Arrive at -------930 am 08-22-3021 ?NPO after MN NO Solid Food.  Clear liquids from MN until---830 am ?Med rec completed ?Medications to take morning of surgery -----escitalopram, loestrin fe ?Diabetic medication -----n/a ?Patient instructed no nail polish to be worn day of surgery ?Patient instructed to bring photo id and insurance card day of surgery ?Patient aware to have Driver (ride ) / caregiver   mother alasia enge will stay  for 24 hours after surgery  ?Patient Special Instructions -----none ?Pre-Op special Istructions -----none ?Patient verbalized understanding of instructions that were given at this phone interview. ?Patient denies shortness of breath, chest pain, fever, cough at this phone interview.  ?

## 2021-08-17 ENCOUNTER — Telehealth (INDEPENDENT_AMBULATORY_CARE_PROVIDER_SITE_OTHER): Payer: 59 | Admitting: Obstetrics & Gynecology

## 2021-08-17 DIAGNOSIS — R1032 Left lower quadrant pain: Secondary | ICD-10-CM

## 2021-08-20 ENCOUNTER — Other Ambulatory Visit (HOSPITAL_COMMUNITY): Payer: Self-pay

## 2021-08-20 DIAGNOSIS — F4322 Adjustment disorder with anxiety: Secondary | ICD-10-CM | POA: Diagnosis not present

## 2021-08-20 MED ORDER — ESCITALOPRAM OXALATE 10 MG PO TABS
10.0000 mg | ORAL_TABLET | Freq: Every morning | ORAL | 1 refills | Status: DC
  Filled 2021-08-20 – 2021-10-10 (×2): qty 90, 90d supply, fill #0
  Filled 2022-01-03: qty 90, 90d supply, fill #1

## 2021-08-20 MED ORDER — ESCITALOPRAM OXALATE 10 MG PO TABS
10.0000 mg | ORAL_TABLET | Freq: Every morning | ORAL | 0 refills | Status: DC
  Filled 2021-08-20: qty 30, 30d supply, fill #0

## 2021-08-22 ENCOUNTER — Ambulatory Visit (HOSPITAL_BASED_OUTPATIENT_CLINIC_OR_DEPARTMENT_OTHER): Payer: 59 | Admitting: Anesthesiology

## 2021-08-22 ENCOUNTER — Encounter (HOSPITAL_BASED_OUTPATIENT_CLINIC_OR_DEPARTMENT_OTHER): Payer: Self-pay | Admitting: Obstetrics & Gynecology

## 2021-08-22 ENCOUNTER — Ambulatory Visit (HOSPITAL_BASED_OUTPATIENT_CLINIC_OR_DEPARTMENT_OTHER)
Admission: RE | Admit: 2021-08-22 | Discharge: 2021-08-22 | Disposition: A | Discharge status: Home / Self Care | Payer: 59 | Attending: Obstetrics & Gynecology | Admitting: Obstetrics & Gynecology

## 2021-08-22 ENCOUNTER — Other Ambulatory Visit (HOSPITAL_COMMUNITY): Payer: Self-pay

## 2021-08-22 ENCOUNTER — Encounter (HOSPITAL_BASED_OUTPATIENT_CLINIC_OR_DEPARTMENT_OTHER)
Admission: RE | Disposition: A | Discharge status: Home / Self Care | Payer: Self-pay | Source: Home / Self Care | Attending: Obstetrics & Gynecology

## 2021-08-22 ENCOUNTER — Other Ambulatory Visit: Payer: Self-pay

## 2021-08-22 DIAGNOSIS — R1032 Left lower quadrant pain: Secondary | ICD-10-CM | POA: Diagnosis not present

## 2021-08-22 DIAGNOSIS — R102 Pelvic and perineal pain: Secondary | ICD-10-CM

## 2021-08-22 HISTORY — DX: Pelvic and perineal pain: R10.2

## 2021-08-22 HISTORY — DX: Presence of spectacles and contact lenses: Z97.3

## 2021-08-22 HISTORY — PX: LAPAROSCOPY: SHX197

## 2021-08-22 HISTORY — DX: Anxiety disorder, unspecified: F41.9

## 2021-08-22 LAB — CBC
HCT: 43.3 % (ref 36.0–46.0)
Hemoglobin: 14.2 g/dL (ref 12.0–15.0)
MCH: 27.7 pg (ref 26.0–34.0)
MCHC: 32.8 g/dL (ref 30.0–36.0)
MCV: 84.6 fL (ref 80.0–100.0)
Platelets: 282 10*3/uL (ref 150–400)
RBC: 5.12 MIL/uL — ABNORMAL HIGH (ref 3.87–5.11)
RDW: 12.5 % (ref 11.5–15.5)
WBC: 6.6 10*3/uL (ref 4.0–10.5)
nRBC: 0 % (ref 0.0–0.2)

## 2021-08-22 LAB — HCG, SERUM, QUALITATIVE: Preg, Serum: NEGATIVE

## 2021-08-22 LAB — POCT PREGNANCY, URINE: Preg Test, Ur: NEGATIVE

## 2021-08-22 SURGERY — LAPAROSCOPY, DIAGNOSTIC
Anesthesia: General | Site: Abdomen

## 2021-08-22 MED ORDER — ROCURONIUM BROMIDE 100 MG/10ML IV SOLN
INTRAVENOUS | Status: DC | PRN
Start: 2021-08-22 — End: 2021-08-22
  Administered 2021-08-22: 60 mg via INTRAVENOUS

## 2021-08-22 MED ORDER — ONDANSETRON HCL 4 MG/2ML IJ SOLN: 4.0000 mg | Freq: Once | INTRAMUSCULAR | Status: DC | PRN

## 2021-08-22 MED ORDER — FENTANYL CITRATE (PF) 100 MCG/2ML IJ SOLN
INTRAMUSCULAR | Status: AC
Start: 2021-08-22 — End: ?
  Filled 2021-08-22: qty 2

## 2021-08-22 MED ORDER — PROPOFOL 10 MG/ML IV BOLUS
INTRAVENOUS | Status: DC | PRN
  Administered 2021-08-22: 150 mg via INTRAVENOUS
  Administered 2021-08-22: 50 mg via INTRAVENOUS

## 2021-08-22 MED ORDER — MIDAZOLAM HCL 2 MG/2ML IJ SOLN
INTRAMUSCULAR | Status: AC
  Filled 2021-08-22: qty 2

## 2021-08-22 MED ORDER — DEXAMETHASONE SODIUM PHOSPHATE 10 MG/ML IJ SOLN
INTRAMUSCULAR | Status: AC
Start: 2021-08-22 — End: ?
  Filled 2021-08-22: qty 2

## 2021-08-22 MED ORDER — DEXAMETHASONE SODIUM PHOSPHATE 4 MG/ML IJ SOLN
INTRAMUSCULAR | Status: DC | PRN
  Administered 2021-08-22: 10 mg via INTRAVENOUS

## 2021-08-22 MED ORDER — MIDAZOLAM HCL 2 MG/2ML IJ SOLN
INTRAMUSCULAR | Status: DC | PRN
  Administered 2021-08-22: 2 mg via INTRAVENOUS

## 2021-08-22 MED ORDER — ACETAMINOPHEN 500 MG PO TABS
1000.0000 mg | ORAL_TABLET | ORAL | Status: AC
  Administered 2021-08-22: 1000 mg via ORAL

## 2021-08-22 MED ORDER — SCOPOLAMINE 1 MG/3DAYS TD PT72
1.0000 | MEDICATED_PATCH | TRANSDERMAL | Status: DC
  Administered 2021-08-22: 1.5 mg via TRANSDERMAL

## 2021-08-22 MED ORDER — SODIUM CHLORIDE 0.9 % IR SOLN
Status: DC | PRN
  Administered 2021-08-22: 500 mL

## 2021-08-22 MED ORDER — OXYCODONE HCL 5 MG PO TABS: 5.0000 mg | ORAL_TABLET | Freq: Once | ORAL | Status: DC | PRN

## 2021-08-22 MED ORDER — BUPIVACAINE HCL (PF) 0.25 % IJ SOLN
INTRAMUSCULAR | Status: AC
  Filled 2021-08-22: qty 30

## 2021-08-22 MED ORDER — FENTANYL CITRATE (PF) 100 MCG/2ML IJ SOLN
INTRAMUSCULAR | Status: AC
  Filled 2021-08-22: qty 2

## 2021-08-22 MED ORDER — LIDOCAINE HCL (CARDIAC) PF 100 MG/5ML IV SOSY
PREFILLED_SYRINGE | INTRAVENOUS | Status: DC | PRN
Start: 2021-08-22 — End: 2021-08-22
  Administered 2021-08-22: 40 mg via INTRAVENOUS

## 2021-08-22 MED ORDER — LACTATED RINGERS IV SOLN: INTRAVENOUS | Status: DC

## 2021-08-22 MED ORDER — ONDANSETRON HCL 4 MG/2ML IJ SOLN
INTRAMUSCULAR | Status: DC | PRN
  Administered 2021-08-22: 4 mg via INTRAVENOUS

## 2021-08-22 MED ORDER — SUGAMMADEX SODIUM 200 MG/2ML IV SOLN
INTRAVENOUS | Status: DC | PRN
  Administered 2021-08-22: 200 mg via INTRAVENOUS

## 2021-08-22 MED ORDER — KETOROLAC TROMETHAMINE 30 MG/ML IJ SOLN: 30.0000 mg | Freq: Once | INTRAMUSCULAR | Status: DC | PRN

## 2021-08-22 MED ORDER — ONDANSETRON HCL 4 MG/2ML IJ SOLN
INTRAMUSCULAR | Status: AC
  Filled 2021-08-22: qty 4

## 2021-08-22 MED ORDER — FENTANYL CITRATE (PF) 100 MCG/2ML IJ SOLN
25.0000 ug | INTRAMUSCULAR | Status: DC | PRN
  Administered 2021-08-22 (×2): 50 ug via INTRAVENOUS

## 2021-08-22 MED ORDER — BUPIVACAINE HCL 0.25 % IJ SOLN
INTRAMUSCULAR | Status: DC | PRN
  Administered 2021-08-22: 3 mL

## 2021-08-22 MED ORDER — ROCURONIUM BROMIDE 10 MG/ML (PF) SYRINGE
PREFILLED_SYRINGE | INTRAVENOUS | Status: AC
  Filled 2021-08-22: qty 20

## 2021-08-22 MED ORDER — KETOROLAC TROMETHAMINE 30 MG/ML IJ SOLN
INTRAMUSCULAR | Status: DC | PRN
  Administered 2021-08-22: 30 mg via INTRAVENOUS

## 2021-08-22 MED ORDER — SCOPOLAMINE 1 MG/3DAYS TD PT72
MEDICATED_PATCH | TRANSDERMAL | Status: AC
  Filled 2021-08-22: qty 1

## 2021-08-22 MED ORDER — OXYCODONE HCL 5 MG/5ML PO SOLN: 5.0000 mg | Freq: Once | ORAL | Status: DC | PRN

## 2021-08-22 MED ORDER — IBUPROFEN 800 MG PO TABS
800.0000 mg | ORAL_TABLET | Freq: Three times a day (TID) | ORAL | 0 refills | Status: DC | PRN
  Filled 2021-08-22: qty 30, 10d supply, fill #0

## 2021-08-22 MED ORDER — FENTANYL CITRATE (PF) 100 MCG/2ML IJ SOLN
INTRAMUSCULAR | Status: DC | PRN
  Administered 2021-08-22 (×2): 50 ug via INTRAVENOUS

## 2021-08-22 MED ORDER — HYDROCODONE-ACETAMINOPHEN 5-325 MG PO TABS
1.0000 | ORAL_TABLET | Freq: Four times a day (QID) | ORAL | 0 refills | Status: DC | PRN
  Filled 2021-08-22: qty 12, 2d supply, fill #0

## 2021-08-22 MED ORDER — ACETAMINOPHEN 500 MG PO TABS
ORAL_TABLET | ORAL | Status: AC
  Filled 2021-08-22: qty 2

## 2021-08-22 MED ORDER — OXYCODONE HCL 5 MG PO TABS
ORAL_TABLET | ORAL | Status: AC
  Filled 2021-08-22: qty 1

## 2021-08-22 MED ORDER — POVIDONE-IODINE 10 % EX SWAB: 2.0000 "application " | Freq: Once | CUTANEOUS | Status: DC

## 2021-08-22 SURGICAL SUPPLY — 67 items
ADH SKN CLS APL DERMABOND .7 (GAUZE/BANDAGES/DRESSINGS) ×2
APL SKNCLS STERI-STRIP NONHPOA (GAUZE/BANDAGES/DRESSINGS)
APL SRG 38 LTWT LNG FL B (MISCELLANEOUS)
APPLICATOR ARISTA FLEXITIP XL (MISCELLANEOUS) IMPLANT
BAG RETRIEVAL 10 (BASKET)
BENZOIN TINCTURE PRP APPL 2/3 (GAUZE/BANDAGES/DRESSINGS) IMPLANT
BLADE CLIPPER SENSICLIP SURGIC (BLADE) IMPLANT
CABLE HIGH FREQUENCY MONO STRZ (ELECTRODE) IMPLANT
CANISTER SUCT 1200ML W/VALVE (MISCELLANEOUS) IMPLANT
CATH ROBINSON RED A/P 16FR (CATHETERS) IMPLANT
COVER MAYO STAND STRL (DRAPES) ×3 IMPLANT
DECANTER SPIKE VIAL GLASS SM (MISCELLANEOUS) ×3 IMPLANT
DERMABOND ADVANCED (GAUZE/BANDAGES/DRESSINGS) ×1
DERMABOND ADVANCED .7 DNX12 (GAUZE/BANDAGES/DRESSINGS) ×2 IMPLANT
DRSG COVADERM PLUS 2X2 (GAUZE/BANDAGES/DRESSINGS) IMPLANT
DRSG OPSITE POSTOP 3X4 (GAUZE/BANDAGES/DRESSINGS) IMPLANT
DRSG TELFA 3X8 NADH (GAUZE/BANDAGES/DRESSINGS) IMPLANT
DURAPREP 26ML APPLICATOR (WOUND CARE) ×3 IMPLANT
GAUZE 4X4 16PLY ~~LOC~~+RFID DBL (SPONGE) ×9 IMPLANT
GLOVE SURG LTX SZ6.5 (GLOVE) ×6 IMPLANT
GLOVE SURG UNDER POLY LF SZ7 (GLOVE) ×9 IMPLANT
GOWN STRL REUS W/TWL LRG LVL3 (GOWN DISPOSABLE) ×6 IMPLANT
HEMOSTAT ARISTA ABSORB 3G PWDR (HEMOSTASIS) IMPLANT
KIT TURNOVER CYSTO (KITS) ×3 IMPLANT
LIGASURE VESSEL 5MM BLUNT TIP (ELECTROSURGICAL) IMPLANT
NDL HYPO 25X1 1.5 SAFETY (NEEDLE) ×1 IMPLANT
NDL INSUFFLATION 14GA 150MM (NEEDLE) IMPLANT
NEEDLE HYPO 25X1 1.5 SAFETY (NEEDLE) ×3 IMPLANT
NEEDLE INSUFFLATION 120MM (ENDOMECHANICALS) ×3 IMPLANT
NEEDLE INSUFFLATION 14GA 150MM (NEEDLE) IMPLANT
NS IRRIG 500ML POUR BTL (IV SOLUTION) ×3 IMPLANT
PACK LAPAROSCOPY BASIN (CUSTOM PROCEDURE TRAY) ×3 IMPLANT
PACK TRENDGUARD 450 HYBRID PRO (MISCELLANEOUS) ×2 IMPLANT
PAD DRESSING TELFA 3X8 NADH (GAUZE/BANDAGES/DRESSINGS) IMPLANT
PAD OB MATERNITY 4.3X12.25 (PERSONAL CARE ITEMS) ×3 IMPLANT
POUCH LAPAROSCOPIC INSTRUMENT (MISCELLANEOUS) ×3 IMPLANT
PROTECTOR NERVE ULNAR (MISCELLANEOUS) ×3 IMPLANT
SCISSORS LAP 5X35 DISP (ENDOMECHANICALS) IMPLANT
SEALER TISSUE G2 CVD JAW 35 (ENDOMECHANICALS) IMPLANT
SEALER TISSUE G2 CVD JAW 45CM (ENDOMECHANICALS)
SET SUCTION IRRIG HYDROSURG (IRRIGATION / IRRIGATOR) IMPLANT
SET TUBE SMOKE EVAC HIGH FLOW (TUBING) ×3 IMPLANT
SHEARS HARMONIC ACE PLUS 36CM (ENDOMECHANICALS) IMPLANT
SLEEVE ADV FIXATION 5X100MM (TROCAR) ×5 IMPLANT
SOLUTION ELECTROLUBE (MISCELLANEOUS) IMPLANT
STRIP CLOSURE SKIN 1/4X4 (GAUZE/BANDAGES/DRESSINGS) IMPLANT
SUT VIC AB 4-0 PS2 18 (SUTURE) ×3 IMPLANT
SUT VIC AB 4-0 SH 27 (SUTURE)
SUT VIC AB 4-0 SH 27XANBCTRL (SUTURE) IMPLANT
SUT VICRYL 0 UR6 27IN ABS (SUTURE) IMPLANT
SYR 10ML LL (SYRINGE) ×3 IMPLANT
SYR 30ML LL (SYRINGE) IMPLANT
SYR 3ML 23GX1 SAFETY (SYRINGE) IMPLANT
SYS BAG RETRIEVAL 10MM (BASKET)
SYSTEM BAG RETRIEVAL 10MM (BASKET) IMPLANT
SYSTEM CARTER THOMASON II (TROCAR) IMPLANT
TOWEL OR 17X26 10 PK STRL BLUE (TOWEL DISPOSABLE) ×3 IMPLANT
TRAY FOLEY W/BAG SLVR 14FR LF (SET/KITS/TRAYS/PACK) IMPLANT
TRENDGUARD 450 HYBRID PRO PACK (MISCELLANEOUS) ×3
TROCAR ADV FIXATION 5X100MM (TROCAR) ×3 IMPLANT
TROCAR BLADELESS OPT 5 100 (ENDOMECHANICALS) ×3 IMPLANT
TROCAR XCEL NON BLADE 8MM B8LT (ENDOMECHANICALS) IMPLANT
TROCAR XCEL NON-BLD 11X100MML (ENDOMECHANICALS) IMPLANT
TROCAR Z-THREAD OPTICAL 5X100M (TROCAR) ×2 IMPLANT
TUBE CONNECTING 12X1/4 (SUCTIONS) IMPLANT
WARMER LAPAROSCOPE (MISCELLANEOUS) ×3 IMPLANT
WATER STERILE IRR 500ML POUR (IV SOLUTION) ×3 IMPLANT

## 2021-08-22 NOTE — Anesthesia Postprocedure Evaluation (Signed)
Anesthesia Post Note ? ?Patient: Christine Estes ? ?Procedure(Estes) Performed: LAPAROSCOPY DIAGNOSTIC (Abdomen) ? ?  ? ?Patient location during evaluation: PACU ?Anesthesia Type: General ?Level of consciousness: awake and alert ?Pain management: pain level controlled ?Vital Signs Assessment: post-procedure vital signs reviewed and stable ?Respiratory status: spontaneous breathing, nonlabored ventilation, respiratory function stable and patient connected to nasal cannula oxygen ?Cardiovascular status: blood pressure returned to baseline and stable ?Postop Assessment: no apparent nausea or vomiting ?Anesthetic complications: no ? ? ?No notable events documented. ? ?Last Vitals:  ?Vitals:  ? 08/22/21 1300 08/22/21 1315  ?BP: (!) 106/51 (!) 109/51  ?Pulse: 61 65  ?Resp: (!) 21 18  ?Temp:    ?SpO2: 95% 94%  ?  ?Last Pain:  ?Vitals:  ? 08/22/21 1247  ?TempSrc:   ?PainSc: 8   ? ? ?  ?  ?  ?  ?  ?  ? ?Christine Estes ? ? ? ? ?

## 2021-08-22 NOTE — Anesthesia Preprocedure Evaluation (Signed)
Anesthesia Evaluation  ?Patient identified by MRN, date of birth, ID band ?Patient awake ? ? ? ?Reviewed: ?Allergy & Precautions, NPO status , Patient's Chart, lab work & pertinent test results ? ?Airway ?Mallampati: II ? ?TM Distance: >3 FB ?Neck ROM: Full ? ? ? Dental ?no notable dental hx. ? ?  ?Pulmonary ?neg pulmonary ROS,  ?  ?Pulmonary exam normal ?breath sounds clear to auscultation ? ? ? ? ? ? Cardiovascular ?negative cardio ROS ?Normal cardiovascular exam ?Rhythm:Regular Rate:Normal ? ? ?  ?Neuro/Psych ?negative neurological ROS ? negative psych ROS  ? GI/Hepatic ?negative GI ROS, Neg liver ROS,   ?Endo/Other  ?negative endocrine ROS ? Renal/GU ?negative Renal ROS  ?negative genitourinary ?  ?Musculoskeletal ?negative musculoskeletal ROS ?(+)  ? Abdominal ?  ?Peds ?negative pediatric ROS ?(+)  Hematology ?negative hematology ROS ?(+)   ?Anesthesia Other Findings ? ? Reproductive/Obstetrics ?negative OB ROS ? ?  ? ? ? ? ? ? ? ? ? ? ? ? ? ?  ?  ? ? ? ? ? ? ? ? ?Anesthesia Physical ?Anesthesia Plan ? ?ASA: 1 ? ?Anesthesia Plan: General  ? ?Post-op Pain Management: Minimal or no pain anticipated and Tylenol PO (pre-op)*  ? ?Induction: Intravenous ? ?PONV Risk Score and Plan: 3 and Ondansetron, Dexamethasone, Midazolam, Treatment may vary due to age or medical condition and Droperidol ? ?Airway Management Planned: Oral ETT ? ?Additional Equipment:  ? ?Intra-op Plan:  ? ?Post-operative Plan: Extubation in OR ? ?Informed Consent: I have reviewed the patients History and Physical, chart, labs and discussed the procedure including the risks, benefits and alternatives for the proposed anesthesia with the patient or authorized representative who has indicated his/her understanding and acceptance.  ? ? ? ?Dental advisory given ? ?Plan Discussed with: CRNA and Surgeon ? ?Anesthesia Plan Comments:   ? ? ? ? ? ? ?Anesthesia Quick Evaluation ? ?

## 2021-08-22 NOTE — Transfer of Care (Signed)
Immediate Anesthesia Transfer of Care Note ? ?Patient: Christine Estes ? ?Procedure(s) Performed: LAPAROSCOPY DIAGNOSTIC (Abdomen) ? ?Patient Location: PACU ? ?Anesthesia Type:General ? ?Level of Consciousness: awake, alert , oriented and patient cooperative ? ?Airway & Oxygen Therapy: Patient Spontanous Breathing and Patient connected to nasal cannula oxygen ? ?Post-op Assessment: Report given to RN and Post -op Vital signs reviewed and stable ? ?Post vital signs: Reviewed and stable ? ?Last Vitals:  ?Vitals Value Taken Time  ?BP 125/60 08/22/21 1240  ?Temp    ?Pulse 83 08/22/21 1241  ?Resp 20 08/22/21 1241  ?SpO2 100 % 08/22/21 1241  ?Vitals shown include unvalidated device data. ? ?Last Pain:  ?Vitals:  ? 08/22/21 0952  ?TempSrc: Oral  ?PainSc: 0-No pain  ?   ? ?Patients Stated Pain Goal: 5 (08/22/21 3735) ? ?Complications: No notable events documented. ?

## 2021-08-22 NOTE — Discharge Instructions (Signed)
DISCHARGE INSTRUCTIONS: Laparoscopy ? ?The following instructions have been prepared to help you care for yourself upon your return home today. ? ?Wound care: ? Do not get the incision wet for the first 24 hours. The incision should be kept clean and dry. ? The Band-Aids or dressings may be removed the day after surgery. ? Should the incision become sore, red, and swollen after the first week, check with your doctor. ? ?Personal hygiene: ? Shower the day after your procedure. ? ?Activity and limitations: ? Do NOT drive or operate any equipment today. ? Do NOT lift anything more than 15 pounds for 2-3 weeks after surgery. ? Do NOT rest in bed all day. ? Walking is encouraged. Walk each day, starting slowly with 5-minute walks 3 or 4 times a day. Slowly increase the length of your walks. ? Walk up and down stairs slowly. ? Do NOT do strenuous activities, such as golfing, playing tennis, bowling, running, biking, weight lifting, gardening, mowing, or vacuuming for 2-4 weeks. Ask your doctor when it is okay to start. ? ?Diet: Eat a light meal as desired this evening. You may resume your usual diet tomorrow. ? ?Return to work: This is dependent on the type of work you do. For the most part you can return to a desk job within a week of surgery. If you are more active at work, please discuss this with your doctor. ? ?What to expect after your surgery: You may have a slight burning sensation when you urinate on the first day. You may have a very small amount of blood in the urine. Expect to have a small amount of vaginal discharge/light bleeding for 1-2 weeks. It is not unusual to have abdominal soreness and bruising for up to 2 weeks. You may be tired and need more rest for about 1 week. You may experience shoulder pain for 24-72 hours. Lying flat in bed may relieve it. ? ?Call your doctor for any of the following: ? Develop a fever of 100.4 or greater ? Inability to urinate 6 hours after discharge from hospital ? Severe  pain not relieved by pain medications ? Persistent of heavy bleeding at incision site ? Redness or swelling around incision site after a week ? Increasing nausea or vomiting ? ?Post Anesthesia Home Care Instructions ? ?Activity: ?Get plenty of rest for the remainder of the day. A responsible individual must stay with you for 24 hours following the procedure.  ?For the next 24 hours, DO NOT: ?-Drive a car ?-Paediatric nurse ?-Drink alcoholic beverages ?-Take any medication unless instructed by your physician ?-Make any legal decisions or sign important papers. ? ?Meals: ?Start with liquid foods such as gelatin or soup. Progress to regular foods as tolerated. Avoid greasy, spicy, heavy foods. If nausea and/or vomiting occur, drink only clear liquids until the nausea and/or vomiting subsides. Call your physician if vomiting continues. ? ?Special Instructions/Symptoms: ?Your throat may feel dry or sore from the anesthesia or the breathing tube placed in your throat during surgery. If this causes discomfort, gargle with warm salt water. The discomfort should disappear within 24 hours. ? ?If you had a scopolamine patch placed behind your ear for the management of post- operative nausea and/or vomiting: ? ?1. The medication in the patch is effective for 72 hours, after which it should be removed.  Wrap patch in a tissue and discard in the trash. Wash hands thoroughly with soap and water. ?2. You may remove the patch earlier than 72 hours  if you experience unpleasant side effects which may include dry mouth, dizziness or visual disturbances. ?3. Avoid touching the patch. Wash your hands with soap and water after contact with the patch. ?   ?No acetaminophen/Tylenol until after 4:00 pm today if needed. ?No ibuprofen, Advil, Aleve, Motrin, ketorolac, meloxicam, naproxen, or other NSAIDS until after 6:30 pm today if needed. ? ? ? ?

## 2021-08-22 NOTE — Progress Notes (Signed)
Virtual Visit via Video Note ? ?I connected with Christine Estes on 08/22/21 at 11:45 AM EST by a video enabled telemedicine application and verified that I am speaking with the correct person using two identifiers. ? ?Location: ?Patient: home ?Provider: office at [place] ?  ?I discussed the limitations of evaluation and management by telemedicine and the availability of in person appointments. The patient expressed understanding and agreed to proceed. ? ?History of Present Illness: ?21 yo G0 SWF with h/o chronic LLQ pain that has been evaluated by orthopedic provider at [facility] as well as with removal of lipoma from Dr. Serita Grammes.  She has done some PT as well.  Nothing has been found in evaluation and the lipoma removal really didn't help with pain.  She is a Museum/gallery exhibitions officer as well.  Pain does interfere with training at times.  She has been on OCPs which hasn't really helped.  We have decided to proceed with laparoscopy for evaluation of endometriosis.  Mother is present for call today.  Procedure reviewed.  Hospital stay, recovery and pain management all discussed.  Risks discussed including but not limited to bleeding, <1% risk of receiving a  transfusion, infection, <1% risk of bowel/bladder/ureteral/vascular injury discussed as well as possible need for additional surgery if injury does occur discussed.  DVT/PE and rare risk of death discussed.  My actual complications with prior surgeries discussed.  Positioning and incision locations discussed.  Patient aware if pathology abnormal she may need additional treatment.  All questions answered.   ? ?They are aware if endometriosis is present, biopsy will be performed and treatment with cauterization will be done as well. ? ?Observations/Objective: ?WNWD WF, NAD ? ?Assessment and Plan: ?21 yo GO SWF with chronic LLQ pain who will proceed with laparoscopy for more definitive evaluation of endometriosis next week.  Questions answered. ? ?Follow Up  Instructions: ?I discussed the assessment and treatment plan with the patient. The patient was provided an opportunity to ask questions and all were answered. The patient agreed with the plan and demonstrated an understanding of the instructions. ?  ?The patient was advised to call back or seek an in-person evaluation if the symptoms worsen or if the condition fails to improve as anticipated. ? ?I provided 28 minutes of non-face-to-face time during this encounter. ? ? ?[name], MD  ?

## 2021-08-22 NOTE — Anesthesia Procedure Notes (Signed)
Procedure Name: Intubation ?Date/Time: 08/22/2021 11:55 AM ?Performed by: Georgeanne Nim, CRNA ?Pre-anesthesia Checklist: Patient identified, Patient being monitored, Timeout performed, Emergency Drugs available and Suction available ?Patient Re-evaluated:Patient Re-evaluated prior to induction ?Oxygen Delivery Method: Circle System Utilized ?Preoxygenation: Pre-oxygenation with 100% oxygen ?Induction Type: IV induction ?Ventilation: Mask ventilation without difficulty ?Laryngoscope Size: Mac and 4 ?Grade View: Grade II ?Tube type: Oral ?Tube size: 7.0 mm ?Number of attempts: 1 ?Airway Equipment and Method: stylet ?Placement Confirmation: ETT inserted through vocal cords under direct vision, positive ETCO2, breath sounds checked- equal and bilateral and CO2 detector ?Secured at: 22 cm ?Tube secured with: Tape ?Dental Injury: Teeth and Oropharynx as per pre-operative assessment  ? ? ? ? ?

## 2021-08-22 NOTE — H&P (Signed)
Christine Estes is an 21 y.o. female.G0 SWF with chronic LLQ pain that has been persistent now over two years.  She's undergone ortho evaluation with a provider at [facility].  A lipoma was noted in the abdominal wall.  This was removed by Dr. Serita Grammes.  She continues to have pain.  She has been on OCPs without any significant improvement in pain.  She is here today for diagnostic laparoscopy and possible treatment of endometriosis  ? ?Pertinent Gynecological History: ?Menses:  regular ?Contraception: abstinence ?DES exposure: denies ?Blood transfusions: none ?Sexually transmitted diseases: no past history ?Previous GYN Procedures:  none   ?Last mammogram: n/a ?Last pap: n/a ?OB History: G0, P0  ? ?Past Medical History:  ?Diagnosis Date  ? Anxiety   ? Pelvic pain   ? LLQ  ? Wears glasses for reading   ? ? ?Past Surgical History:  ?Procedure Laterality Date  ? MASS EXCISION N/A 08/03/2020  ? Procedure: EXCISION ABDOMINAL WALL MASS;  Surgeon: Rolm Bookbinder, MD;  Location: [place];  Service: General;  Laterality: N/A;  ? ? ?Family History  ?Problem Relation Age of Onset  ? Endometriosis Mother   ? Thyroid disease Maternal Grandmother   ? Heart disease Paternal Grandfather   ? Thyroid disease Other   ? ? ?Social History:  reports that she has never smoked. She has never used smokeless tobacco. She reports that she does not drink alcohol and does not use drugs. ? ?Allergies: No Known Allergies ? ?Medications Prior to Admission  ?Medication Sig Dispense Refill Last Dose  ? escitalopram (LEXAPRO) 10 MG tablet Take 1 tablet (10 mg total) by mouth every morning. 90 tablet 1 08/22/2021 at 0900  ? norethindrone-ethinyl estradiol-FE (LOESTRIN FE) 1-20 MG-MCG tablet Take 1 tablet by mouth daily. 84 tablet 3 08/22/2021 at 0900  ? ? ?Review of Systems  ?All other systems reviewed and are negative. ? ?Blood pressure 125/72, pulse 68, temperature 98.9 ?F (37.2 ?C), temperature source Oral, resp. rate 15, height 5'  5" (1.651 m), weight 73.6 kg, SpO2 97 %. ?Physical Exam ?Constitutional:   ?   Appearance: Normal appearance.  ?Cardiovascular:  ?   Rate and Rhythm: Normal rate.  ?Pulmonary:  ?   Effort: Pulmonary effort is normal.  ?Abdominal:  ?   General: Abdomen is flat.  ?   Palpations: Abdomen is soft.  ?Neurological:  ?   General: No focal deficit present.  ?   Mental Status: She is alert.  ? ? ?Results for orders placed or performed during the hospital encounter of 08/22/21 (from the past 24 hour(s))  ?CBC     Status: Abnormal  ? Collection Time: 08/22/21 10:00 AM  ?Result Value Ref Range  ? WBC 6.6 4.0 - 10.5 K/uL  ? RBC 5.12 (H) 3.87 - 5.11 MIL/uL  ? Hemoglobin 14.2 12.0 - 15.0 g/dL  ? HCT 43.3 36.0 - 46.0 %  ? MCV 84.6 80.0 - 100.0 fL  ? MCH 27.7 26.0 - 34.0 pg  ? MCHC 32.8 30.0 - 36.0 g/dL  ? RDW 12.5 11.5 - 15.5 %  ? Platelets 282 150 - 400 K/uL  ? nRBC 0.0 0.0 - 0.2 %  ?hCG, Serum, Qualitative (Not at [facility])     Status: None  ? Collection Time: 08/22/21 10:00 AM  ?Result Value Ref Range  ? Preg, Serum NEGATIVE NEGATIVE  ?Pregnancy, urine POC     Status: None  ? Collection Time: 08/22/21 10:45 AM  ?Result Value Ref  Range  ? Preg Test, Ur NEGATIVE NEGATIVE  ? ? ?No results found. ? ?Assessment/Plan: ?21 yo G0 SWF with chronic LLQ pain here for laparoscopy with possible treatment of endometriosis, possible LSO.  Procedure reviewed.  Consent updated.  Pt ready to proceed. ? ?[name] ?08/22/2021, 11:39 AM ? ?

## 2021-08-22 NOTE — Op Note (Addendum)
08/22/2021 ? ?12:51 PM ? ?PATIENT:  Christine Estes  21 y.o. female ? ?PRE-OPERATIVE DIAGNOSIS:  Pelvic Pain, possible endometriosis ? ?POST-OPERATIVE DIAGNOSIS:  Pelvic Pain ? ?PROCEDURE:  Procedure(s): ?LAPAROSCOPY DIAGNOSTIC ? ?SURGEON:  [name] ? ?ASSISTANTS: Dr. Elgie Congo.  Due to possible complexity of procedure and age of patient, surgical assistant was requested.  As well, RNFA was unavailable to help with case. ? ?ANESTHESIA:   general ? ?ESTIMATED BLOOD LOSS: 0 mL ? ?BLOOD ADMINISTERED:none  ? ?FLUIDS: 1000cc LR ? ?UOP: 75 clear ? ?SPECIMEN:  none ? ?DISPOSITION OF SPECIMEN:  N/A ? ?FINDINGS: normal upper abdomen, pelvis ? ?DESCRIPTION OF OPERATION: Patient is taken to the operating room. She is placed in the supine position. She is a running IV in place. Informed consent was present on the chart. SCDs on her lower extremities and functioning properly. Patient was positioned while she was awake.  Her legs were placed in the low lithotomy position in [place]. Her arms were tucked by the side.  General endotracheal anesthesia was administered by the anesthesia staff without difficulty. Dr. Kalman Shan, anesthesia, oversaw case.  Time out performed.   ? ?Chlora prep was then used to prep the abdomen and Betadine was used to prep the inner thighs, perineum and vagina. Once 3 minutes had past the patient was draped in a normal standard fashion. The legs were lifted to the high lithotomy position. The cervix was visualized by placing a heavy weighted speculum in the posterior aspect of the vagina and using a curved Deaver retractor to the retract anteriorly. The anterior lip of the cervix was grasped with single-tooth tenaculum.  Acorn uterine manipulator was attached to  was placed through the cervical os. Dever retractors removed.  A Foley catheter was placed to straight drain.  Clear urine was noted. Legs were lowered to the low lithotomy position and attention was turned the abdomen. ? ?The umbilicus was  everted.  A Veress needle was obtained. Syringe of sterile saline was placed on a open Veress needle.  This was passed into the umbilicus until just when the fluid started to drip.  Then low flow CO2 gas was attached the needle and the pneumoperitoneum was achieved without difficulty. Once 3 liters of gas was in the abdomen the Veress needle was removed and a 5 millimeter non-bladed Optiview trocar and port were passed directly to the abdomen. The laparoscope was then used to confirm intraperitoneal placement.  Pt placed in Trendelenburg positioning.  Entire upper abdomen and pelvis surveyed.  Normal findings noted.  No evidence of endometriosis noted.  Photodocumentation made.     The remaining instruments were removed.  The ports (except the suprapubic port) were removed under direct visualization of the laparoscope and the pneumoperitoneum was relieved.  The patient was taken out of Trendelenburg positioning.  No hernias noted.   ? ?At this point, procedure was ended.  Pneumoperitoneum was relieved.  Several deep breaths were given to the patient's trying to any gas the abdomen and the port was removed.   ? ?The skin was then closed with subcuticular stitches of 3-0 Vicryl. The skin was cleansed Dermabond was applied. Attention was then turned the vagina.  Instruments removed.  Minimal bleeding from cervix was noted.  Foley was removed.    ? ?Sponge, lap, needle, initially counts were correct x2. Patient tolerated the procedure very well. She was awakened from anesthesia, extubated and taken to recovery in stable condition.  ? ? ?COUNTS:  YES ? ?PLAN  OF CARE: Transfer to PACU ? ?

## 2021-08-23 ENCOUNTER — Encounter (HOSPITAL_BASED_OUTPATIENT_CLINIC_OR_DEPARTMENT_OTHER): Payer: Self-pay | Admitting: Obstetrics & Gynecology

## 2021-08-25 ENCOUNTER — Other Ambulatory Visit (HOSPITAL_COMMUNITY): Payer: Self-pay

## 2021-09-16 ENCOUNTER — Encounter (HOSPITAL_BASED_OUTPATIENT_CLINIC_OR_DEPARTMENT_OTHER): Payer: Self-pay | Admitting: Obstetrics & Gynecology

## 2021-09-19 ENCOUNTER — Other Ambulatory Visit (HOSPITAL_BASED_OUTPATIENT_CLINIC_OR_DEPARTMENT_OTHER): Payer: Self-pay | Admitting: Obstetrics & Gynecology

## 2021-09-19 ENCOUNTER — Other Ambulatory Visit (HOSPITAL_COMMUNITY): Payer: Self-pay

## 2021-09-19 DIAGNOSIS — L539 Erythematous condition, unspecified: Secondary | ICD-10-CM

## 2021-09-19 MED ORDER — TRIAMCINOLONE ACETONIDE 0.5 % EX OINT
1.0000 "application " | TOPICAL_OINTMENT | Freq: Two times a day (BID) | CUTANEOUS | 0 refills | Status: DC
  Filled 2021-09-19: qty 30, 15d supply, fill #0

## 2021-09-19 MED ORDER — SULFAMETHOXAZOLE-TRIMETHOPRIM 800-160 MG PO TABS
1.0000 | ORAL_TABLET | Freq: Two times a day (BID) | ORAL | 0 refills | Status: DC
  Filled 2021-09-19: qty 10, 5d supply, fill #0

## 2021-10-10 ENCOUNTER — Other Ambulatory Visit (HOSPITAL_COMMUNITY): Payer: Self-pay

## 2021-11-03 ENCOUNTER — Other Ambulatory Visit (HOSPITAL_COMMUNITY): Payer: Self-pay

## 2021-11-06 ENCOUNTER — Other Ambulatory Visit (HOSPITAL_COMMUNITY): Payer: Self-pay

## 2021-11-06 DIAGNOSIS — F4322 Adjustment disorder with anxiety: Secondary | ICD-10-CM | POA: Diagnosis not present

## 2021-11-06 MED ORDER — ESCITALOPRAM OXALATE 10 MG PO TABS
ORAL_TABLET | ORAL | 1 refills | Status: DC
  Filled 2021-11-06: qty 90, 90d supply, fill #0

## 2021-11-08 ENCOUNTER — Ambulatory Visit: Payer: 59 | Admitting: Family Medicine

## 2021-11-08 ENCOUNTER — Encounter: Payer: Self-pay | Admitting: Family Medicine

## 2021-11-08 VITALS — BP 100/70 | HR 72 | Temp 97.9°F | Ht 65.0 in | Wt 163.0 lb

## 2021-11-08 DIAGNOSIS — D508 Other iron deficiency anemias: Secondary | ICD-10-CM

## 2021-11-08 DIAGNOSIS — F4322 Adjustment disorder with anxiety: Secondary | ICD-10-CM | POA: Diagnosis not present

## 2021-11-08 DIAGNOSIS — R350 Frequency of micturition: Secondary | ICD-10-CM

## 2021-11-08 DIAGNOSIS — R5383 Other fatigue: Secondary | ICD-10-CM

## 2021-11-08 LAB — IBC + FERRITIN
Ferritin: 13.6 ng/mL (ref 10.0–291.0)
Iron: 109 ug/dL (ref 42–145)
Saturation Ratios: 21.9 % (ref 20.0–50.0)
TIBC: 497 ug/dL — ABNORMAL HIGH (ref 250.0–450.0)
Transferrin: 355 mg/dL (ref 212.0–360.0)

## 2021-11-08 LAB — COMPREHENSIVE METABOLIC PANEL
ALT: 14 U/L (ref 0–35)
AST: 18 U/L (ref 0–37)
Albumin: 4.1 g/dL (ref 3.5–5.2)
Alkaline Phosphatase: 45 U/L — ABNORMAL LOW (ref 47–119)
BUN: 10 mg/dL (ref 6–23)
CO2: 26 mEq/L (ref 19–32)
Calcium: 9.6 mg/dL (ref 8.4–10.5)
Chloride: 105 mEq/L (ref 96–112)
Creatinine, Ser: 0.73 mg/dL (ref 0.40–1.20)
GFR: 119.12 mL/min (ref >60.00)
Glucose, Bld: 83 mg/dL (ref 70–99)
Potassium: 4.1 mEq/L (ref 3.5–5.1)
Sodium: 138 mEq/L (ref 135–145)
Total Bilirubin: 0.4 mg/dL (ref 0.2–1.2)
Total Protein: 7.2 g/dL (ref 6.0–8.3)

## 2021-11-08 LAB — URINALYSIS, ROUTINE W REFLEX MICROSCOPIC
Bilirubin Urine: NEGATIVE
Hgb urine dipstick: NEGATIVE
Ketones, ur: NEGATIVE
Nitrite: NEGATIVE
RBC / HPF: NONE SEEN (ref 0–?)
Specific Gravity, Urine: 1.025 (ref 1.000–1.030)
Total Protein, Urine: NEGATIVE
Urine Glucose: NEGATIVE
Urobilinogen, UA: 0.2 (ref 0.0–1.0)
pH: 6 (ref 5.0–8.0)

## 2021-11-08 LAB — CBC WITH DIFFERENTIAL/PLATELET
Basophils Absolute: 0.1 10*3/uL (ref 0.0–0.1)
Basophils Relative: 0.6 % (ref 0.0–3.0)
Eosinophils Absolute: 0.2 10*3/uL (ref 0.0–0.7)
Eosinophils Relative: 2 % (ref 0.0–5.0)
HCT: 39.7 % (ref 36.0–49.0)
Hemoglobin: 13.3 g/dL (ref 12.0–16.0)
Lymphocytes Relative: 31.6 % (ref 24.0–48.0)
Lymphs Abs: 2.6 10*3/uL (ref 0.7–4.0)
MCHC: 33.5 g/dL (ref 31.0–37.0)
MCV: 83.6 fl (ref 78.0–98.0)
Monocytes Absolute: 0.6 10*3/uL (ref 0.1–1.0)
Monocytes Relative: 7.1 % (ref 3.0–12.0)
Neutro Abs: 4.8 10*3/uL (ref 1.4–7.7)
Neutrophils Relative %: 58.7 % (ref 43.0–71.0)
Platelets: 251 10*3/uL (ref 150.0–575.0)
RBC: 4.75 Mil/uL (ref 3.80–5.70)
RDW: 13.2 % (ref 11.4–15.5)
WBC: 8.2 10*3/uL (ref 4.5–13.5)

## 2021-11-08 LAB — VITAMIN B12: Vitamin B-12: 268 pg/mL (ref 211–911)

## 2021-11-08 LAB — TSH: TSH: 0.88 u[IU]/mL (ref 0.40–5.00)

## 2021-11-08 NOTE — Progress Notes (Signed)
New Patient Office Visit  Subjective:  Patient ID: Christine Estes, female    DOB: 2000/11/13  Age: 21 y.o. MRN: [id number]  CC:  Chief Complaint  Patient presents with   Establish Care    Need new pcp Was checked last month at school, borderline anemia Fasting     HPI Christine Estes presents for new pt End of semester-no enery, fatigue, sleeping a lot.  Did labs and "borderline anemia".  Menses for 2-3 days and light. No dark stools.  Some better, but still low energy, not napping. Anx-doing well on meds.  No SI.   Wakes up in am w/ha for 2-3 wks. Drinks coffee in am and better. HA usu on L.   Checked 09/20/21 for fatigue, skin infection.  Hgb 12.9, wbc 8.44 plt 277.  Ferritin 15 TIBC 490(high), sat 14(low), iron 68  Past Medical History:  Diagnosis Date   Anxiety    Pelvic pain    LLQ   Wears glasses for reading     Past Surgical History:  Procedure Laterality Date   LAPAROSCOPY N/A 08/22/2021   Procedure: LAPAROSCOPY DIAGNOSTIC;  Surgeon: [name], MD;  Location: [facility];  Service: Gynecology;  Laterality: N/A;   MASS EXCISION N/A 08/03/2020   Procedure: EXCISION ABDOMINAL WALL MASS;  Surgeon: Rolm Bookbinder, MD;  Location: [place];  Service: General;  Laterality: N/A;    Family History  Problem Relation Age of Onset   Endometriosis Mother    Thyroid disease Maternal Grandmother    Heart disease Paternal Grandfather    Thyroid disease Other     Social History   Socioeconomic History   Marital status: Single    Spouse name: Not on file   Number of children: Not on file   Years of education: Not on file   Highest education level: Not on file  Occupational History   Occupation: Student  Tobacco Use   Smoking status: Never   Smokeless tobacco: Never  Vaping Use   Vaping Use: Never used  Substance and Sexual Activity   Alcohol use: Never   Drug use: Never   Sexual activity: Never  Other Topics Concern   Not on  file  Social History Narrative   In college-[employer] in [place]-psych.  [place]   Social Determinants of Radio broadcast assistant Strain: Not on file  Food Insecurity: Not on file  Transportation Needs: Not on file  Physical Activity: Not on file  Stress: Not on file  Social Connections: Not on file  Intimate Partner Violence: Not on file    ROS  ROS: Gen: no fever, chills  Skin: no rash, itching ENT: no ear pain, ear drainage, nasal congestion, rhinorrhea, sinus pressure, sore throat Eyes: no blurry vision, double vision Resp: no cough, wheeze,SOB CV: no CP, palpitations, LE edema,  GI: no heartburn, n/v/d/c, abd pain GU: no dysuria, urgency, .  Even after urinates, has to go again.  Good stream.no dribble.  Started about March-since exp lap. MSK: no joint pain, myalgias, back pain Neuro: no dizziness,  weakness, vertigo Psych: no depression, anxiety, insomnia, SI   Objective:   Today's Vitals: BP 100/70   Pulse 72   Temp 97.9 F (36.6 C) (Temporal)   Ht '5\' 5"'$  (1.651 m)   Wt 163 lb (73.9 kg)   LMP 10/07/2021 (Exact Date) Comment: 10/07/2021-10/13/2021  SpO2 98%   BMI 27.12 kg/m   Physical Exam  Gen: WDWN  NAD wf HEENT: NCAT, conjunctiva not injected, sclera nonicteric TM WNL B, OP moist, no exudates  NECK:  supple, no thyromegaly, no nodes, no carotid bruits CARDIAC: RRR, S1S2+, no murmur. DP 2+B LUNGS: CTAB. No wheezes ABDOMEN:  BS+, soft, NTND, No HSM, no masses EXT:  no edema MSK: no gross abnormalities.  NEURO: A&O x3.  CN II-XII intact.  PSYCH: normal mood. Good eye contact   Assessment & Plan:   Problem List Items Addressed This Visit       Other   Fatigue   Relevant Orders   Comprehensive metabolic panel   TSH   IBC + Ferritin   Vitamin B12   CBC with Differential/Platelet   Urine Culture   Urinalysis, Routine w reflex microscopic   Other Visit Diagnoses     Other iron deficiency anemia    -  Primary   Relevant  Orders   IBC + Ferritin   Vitamin B12   CBC with Differential/Platelet   Urinary frequency       Relevant Orders   Urine Culture   Urinalysis, Routine w reflex microscopic      Iron def anemia-no heavy menses.  Iron stores were a little low last month.  Pt to check if vitamin has iron in it.  Check cbc,iron studies, B12. Fatigue-not depression.  Check cmp, cbc, tsh.   Urinary freq-since exp lap-check UA and cx.  Bladder doesn't sound like emptying correctly. Advised to notify surgeon(Dr. Sabra Heck).  Am HA-adjust pillow.  Check labs.  Monitor for now   Outpatient Encounter Medications as of 11/08/2021  Medication Sig   escitalopram (LEXAPRO) 10 MG tablet Take 1 tablet (10 mg total) by mouth every morning.   ibuprofen (ADVIL) 800 MG tablet Take 1 tablet by mouth every 8 hours as needed.   norethindrone-ethinyl estradiol-FE (LOESTRIN FE) 1-20 MG-MCG tablet Take 1 tablet by mouth daily.   [DISCONTINUED] escitalopram (LEXAPRO) 10 MG tablet Take 1 tablet by mouth in the morning   [DISCONTINUED] HYDROcodone-acetaminophen (NORCO/VICODIN) 5-325 MG tablet Take 1-2 tablets by mouth every 6  hours as needed for moderate pain. (Patient not taking: Reported on 11/08/2021)   [DISCONTINUED] sulfamethoxazole-trimethoprim (BACTRIM DS) 800-160 MG tablet Take 1 tablet by mouth 2 times daily. (Patient not taking: Reported on 11/08/2021)   [DISCONTINUED] triamcinolone ointment (KENALOG) 0.5 % Apply topically 2 times daily. Use for up to 7 days (Patient not taking: Reported on 11/08/2021)   No facility-administered encounter medications on file as of 11/08/2021.    Follow-up: Return in about 1 year (around 11/09/2022) for annual.   Wellington Hampshire, MD

## 2021-11-08 NOTE — Patient Instructions (Addendum)
Welcome to [facility]! It was a pleasure meeting you today.  As discussed, Please schedule a 12 month follow up visit today.  Multivitamin w/iron-one/day Call Dr. Sabra Heck about urine  PLEASE NOTE:  If you had any LAB tests please let us know if you have not heard back within a few days. You may see your results on MyChart before we have a chance to review them but we will give you a call once they are reviewed by [place]. If we ordered any REFERRALS today, please let us know if you have not heard from their office within the next week.  Let us know through MyChart if you are needing REFILLS, or have your pharmacy send [place] the request. You can also use MyChart to communicate with me or any office staff.  Please try these tips to maintain a healthy lifestyle:  Eat most of your calories during the day when you are active. Eliminate processed foods including packaged sweets (pies, cakes, cookies), reduce intake of potatoes, white bread, white pasta, and white rice. Look for whole grain options, oat flour or almond flour.  Each meal should contain half fruits/vegetables, one quarter protein, and one quarter carbs (no bigger than a computer mouse).  Cut down on sweet beverages. This includes juice, soda, and sweet tea. Also watch fruit intake, though this is a healthier sweet option, it still contains natural sugar! Limit to 3 servings daily.  Drink at least 1 glass of water with each meal and aim for at least 8 glasses per day  Exercise at least 150 minutes every week.

## 2021-11-09 LAB — URINE CULTURE
MICRO NUMBER:: 13445140
SPECIMEN QUALITY:: ADEQUATE

## 2021-11-19 DIAGNOSIS — F429 Obsessive-compulsive disorder, unspecified: Secondary | ICD-10-CM | POA: Diagnosis not present

## 2021-11-30 ENCOUNTER — Ambulatory Visit (HOSPITAL_BASED_OUTPATIENT_CLINIC_OR_DEPARTMENT_OTHER): Payer: 59 | Admitting: Obstetrics & Gynecology

## 2021-11-30 ENCOUNTER — Encounter (HOSPITAL_BASED_OUTPATIENT_CLINIC_OR_DEPARTMENT_OTHER): Payer: Self-pay | Admitting: Obstetrics & Gynecology

## 2021-11-30 ENCOUNTER — Other Ambulatory Visit (HOSPITAL_COMMUNITY): Payer: Self-pay

## 2021-11-30 VITALS — BP 111/62 | HR 56 | Ht 65.0 in | Wt 162.6 lb

## 2021-11-30 DIAGNOSIS — R300 Dysuria: Secondary | ICD-10-CM | POA: Diagnosis not present

## 2021-11-30 DIAGNOSIS — R350 Frequency of micturition: Secondary | ICD-10-CM | POA: Diagnosis not present

## 2021-11-30 LAB — POCT URINALYSIS DIPSTICK
Bilirubin, UA: NEGATIVE
Glucose, UA: NEGATIVE
Ketones, UA: NEGATIVE
Nitrite, UA: NEGATIVE
Protein, UA: NEGATIVE
Spec Grav, UA: 1.02 (ref 1.010–1.025)
Urobilinogen, UA: 0.2 E.U./dL
pH, UA: 5.5 (ref 5.0–8.0)

## 2021-11-30 MED ORDER — NITROFURANTOIN MONOHYD MACRO 100 MG PO CAPS
100.0000 mg | ORAL_CAPSULE | Freq: Two times a day (BID) | ORAL | 0 refills | Status: DC
  Filled 2021-11-30: qty 10, 5d supply, fill #0

## 2021-11-30 MED ORDER — PHENAZOPYRIDINE HCL 200 MG PO TABS
200.0000 mg | ORAL_TABLET | Freq: Three times a day (TID) | ORAL | 0 refills | Status: DC | PRN
  Filled 2021-11-30: qty 6, 2d supply, fill #0

## 2021-11-30 NOTE — Progress Notes (Unsigned)
GYNECOLOGY  VISIT  CC:   pelvic pressure/pain  HPI: 21 y.o. G0P0000 Single White or Caucasian female here for complaint of sensation that her bladder is "hard" and that she sometimes she has a sensation that something is present that shouldn't be.  She reports she started being sexually active with her boyfriend and has some pain with intercourse at times.  She reports it's like something is in the way.    She reports she been having urinary urgency for three months.  Patient came in today to give a urine specimen.  She feels like she has to urinate every 5 minutes with very little coming out each time. She is taking cranberry tablets.    Pt has been experiencing LLQ pain for several years.  Had laparoscopy 08/22/2021 and this did not show any endometriosis.     Past Medical History:  Diagnosis Date   Anxiety    Pelvic pain    LLQ   Wears glasses for reading     MEDS:   Current Outpatient Medications on File Prior to Visit  Medication Sig Dispense Refill   escitalopram (LEXAPRO) 10 MG tablet Take 1 tablet (10 mg total) by mouth every morning. 90 tablet 1   ibuprofen (ADVIL) 800 MG tablet Take 1 tablet by mouth every 8 hours as needed. 30 tablet 0   norethindrone-ethinyl estradiol-FE (LOESTRIN FE) 1-20 MG-MCG tablet Take 1 tablet by mouth daily. 84 tablet 3   No current facility-administered medications on file prior to visit.    ALLERGIES: Patient has no known allergies.  SH:  single, non smoker  Review of Systems  Constitutional: Negative.     PHYSICAL EXAMINATION:    BP 111/62 (BP Location: Left Arm, Patient Position: Sitting, Cuff Size: Normal)   Pulse (!) 56   Ht '5\' 5"'$  (1.651 m) Comment: Reported  Wt 162 lb 9.6 oz (73.8 kg)   LMP 11/12/2021 (Approximate)   BMI 27.06 kg/m     General appearance: alert, cooperative and appears stated age Abdomen: soft, non-tender; bowel sounds normal; no masses,  no organomegaly Lymph:  no inguinal LAD noted  Pelvic: External  genitalia:  no lesions              Urethra:  normal appearing urethra with no masses, tenderness or lesions              Bartholins and Skenes: normal                   Assessment/Plan: 1. Frequency of urination - POCT Urinalysis Dipstick - Urine Culture - nitrofurantoin, macrocrystal-monohydrate, (MACROBID) 100 MG capsule; Take 1 capsule (100 mg total) by mouth 2 (two) times daily.  Dispense: 10 capsule; Refill: 0 - discussed IC with pt.  Elimination diet information discussed.  Recommended stopping cranberry tablets.  She does drink a lot of coffee and is going to consider stopping this as well.  2. Dysuria - phenazopyridine (PYRIDIUM) 200 MG tablet; Take 1 tablet (200 mg total) by mouth 3 (three) times daily as needed for pain.  Dispense: 6 tablet; Refill: 0

## 2021-12-02 LAB — URINE CULTURE

## 2021-12-03 DIAGNOSIS — F429 Obsessive-compulsive disorder, unspecified: Secondary | ICD-10-CM | POA: Diagnosis not present

## 2022-01-03 ENCOUNTER — Other Ambulatory Visit (HOSPITAL_COMMUNITY): Payer: Self-pay

## 2022-01-04 ENCOUNTER — Other Ambulatory Visit (HOSPITAL_COMMUNITY): Payer: Self-pay

## 2022-01-10 ENCOUNTER — Other Ambulatory Visit (HOSPITAL_COMMUNITY): Payer: Self-pay

## 2022-01-10 DIAGNOSIS — F4322 Adjustment disorder with anxiety: Secondary | ICD-10-CM | POA: Diagnosis not present

## 2022-01-10 MED ORDER — ESCITALOPRAM OXALATE 10 MG PO TABS
10.0000 mg | ORAL_TABLET | ORAL | 1 refills | Status: DC
  Filled 2022-01-10 – 2022-03-29 (×2): qty 90, 90d supply, fill #0

## 2022-01-18 ENCOUNTER — Other Ambulatory Visit (HOSPITAL_COMMUNITY): Payer: Self-pay

## 2022-03-11 ENCOUNTER — Encounter: Payer: Self-pay | Admitting: *Deleted

## 2022-03-25 ENCOUNTER — Encounter: Payer: Self-pay | Admitting: Internal Medicine

## 2022-03-29 ENCOUNTER — Other Ambulatory Visit (HOSPITAL_COMMUNITY): Payer: Self-pay

## 2022-05-06 ENCOUNTER — Other Ambulatory Visit (HOSPITAL_COMMUNITY): Payer: Self-pay

## 2022-06-04 ENCOUNTER — Encounter: Payer: Self-pay | Admitting: Internal Medicine

## 2022-06-04 ENCOUNTER — Ambulatory Visit: Payer: 59 | Admitting: Internal Medicine

## 2022-06-04 ENCOUNTER — Other Ambulatory Visit (HOSPITAL_COMMUNITY): Payer: Self-pay

## 2022-06-04 VITALS — BP 116/72 | HR 78 | Ht 65.0 in | Wt 166.4 lb

## 2022-06-04 DIAGNOSIS — R070 Pain in throat: Secondary | ICD-10-CM

## 2022-06-04 DIAGNOSIS — R112 Nausea with vomiting, unspecified: Secondary | ICD-10-CM | POA: Diagnosis not present

## 2022-06-04 DIAGNOSIS — K219 Gastro-esophageal reflux disease without esophagitis: Secondary | ICD-10-CM

## 2022-06-04 MED ORDER — OMEPRAZOLE 40 MG PO CPDR
40.0000 mg | DELAYED_RELEASE_CAPSULE | Freq: Every day | ORAL | 3 refills | Status: DC
  Filled 2022-06-04: qty 30, 30d supply, fill #0
  Filled 2022-06-29: qty 30, 30d supply, fill #1
  Filled 2022-07-31: qty 30, 30d supply, fill #2

## 2022-06-04 NOTE — Progress Notes (Signed)
Chief Complaint: GERD, N&V  HPI : 21 year old female with history of anxiety presents with GERD and N&V  Patient presents with her mother to her clinic visit. She has had burning sensation in her throat or the last 2 years. Anytime she eats or drinks anything, she will make weird noises. She feels the urge to burp but states that she is not able to burp effective. Denies dysphagia. She has to throw up once or twice in the past. The symptoms are worst when she doesn't have a full night's sleep. Denies ab pain. She can feel bloated at times, and then the symptoms are worse. The types of foods that she eats doesn't seem to make a difference. She does tend to eat foods quickly. She started Lexapro last year, which has not affected her symptoms. She has tried Pepcid, which has not helped. Endorses regurgitation. Mother has UC. Dad has EoE. Grandfather and great-grandfather had swallowing issues as well. She occasionally drinks alcohol. Denies marijuana use. She feels a pounding sensation in her upper abdomen at times. She does not drink any carbonated beverages. 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 She is potentially interested in becoming a PA in the future.  Past Medical History:  Diagnosis Date   Anxiety    Pelvic pain    LLQ   Wears glasses for reading    Past Surgical History:  Procedure Laterality Date   LAPAROSCOPY N/A 08/22/2021   Procedure: LAPAROSCOPY DIAGNOSTIC;  Surgeon: [name], MD;  Location: [facility];  Service: Gynecology;  Laterality: N/A;   MASS EXCISION N/A 08/03/2020   Procedure: EXCISION ABDOMINAL WALL MASS;  Surgeon: Rolm Bookbinder, MD;  Location: [place];  Service: General;  Laterality: N/A;   Family History  Problem Relation Age of Onset   Endometriosis Mother    Ulcerative colitis Mother    Other Father        Eosinophilic esophagitis   Thyroid disease Maternal  Grandmother    Cancer Maternal Grandmother    Heart disease Paternal Grandfather    Thyroid disease Other    Social History   Tobacco Use   Smoking status: Never   Smokeless tobacco: Never  Vaping Use   Vaping Use: Never used  Substance Use Topics   Alcohol use: Never   Drug use: Never   Current Outpatient Medications  Medication Sig Dispense Refill   escitalopram (LEXAPRO) 10 MG tablet Take 1 tablet (10 mg total) by mouth every morning. 90 tablet 1   ibuprofen (ADVIL) 800 MG tablet Take 1 tablet by mouth every 8 hours as needed. (Patient not taking: Reported on 06/04/2022) 30 tablet 0   nitrofurantoin, macrocrystal-monohydrate, (MACROBID) 100 MG capsule Take 1 capsule (100 mg total) by mouth 2 (two) times daily. (Patient not taking: Reported on 06/04/2022) 10 capsule 0   norethindrone-ethinyl estradiol-FE (LOESTRIN FE) 1-20 MG-MCG tablet Take 1 tablet by mouth daily. (Patient not taking: Reported on 06/04/2022) 84 tablet 3   phenazopyridine (PYRIDIUM) 200 MG tablet Take 1 tablet (200 mg total) by mouth 3 (three) times daily as needed for pain. (Patient not taking: Reported on 06/04/2022) 6 tablet 0   No current facility-administered medications for this visit.   Not on File  Review of Systems: All systems reviewed and negative except where noted in HPI.   Physical Exam: BP 116/72 (BP Location: Left Arm, Patient Position: Sitting, Cuff Size: Normal)   Pulse 78  Ht '5\' 5"'$  (1.651 m)   Wt 166 lb 6 oz (75.5 kg)   SpO2 98%   BMI 27.69 kg/m  Constitutional: Pleasant,well-developed, female in no acute distress. HEENT: Normocephalic and atraumatic. Conjunctivae are normal. No scleral icterus. Cardiovascular: Normal rate, regular rhythm.  Pulmonary/chest: Effort normal and breath sounds normal. No wheezing, rales or rhonchi. Abdominal: Soft, nondistended, nontender. Bowel sounds active throughout. There are no masses palpable. No hepatomegaly. Extremities: No edema Neurological:  Alert and oriented to person place and time. Skin: Skin is warm and dry. No rashes noted. Psychiatric: Normal mood and affect. Behavior is normal.  Labs 10/2021: CBC unremarkable. Vit B12 nml.   Ab U/S 07/05/20: IMPRESSION: Negative ultrasound of the LEFT lower quadrant abdominal wall  MRI Pelvis w/contrast 03/21/21: IMPRESSION: 2.6 cm left ovarian simple-appearing cyst, most likely physiologic in a reproductive age female. No follow-up imaging is recommended. Reference: JACR 2020 Feb;17(2):248-254 No other significant abnormality identified.  ASSESSMENT AND PLAN: GERD Throat burning N&V Belching Patient presents with throat burning over the last 2 years that is accompanied by what sounds like belching/air escaping from her upper GI tract. She has had a few episodes of N&V due to her symptoms. I do think that she has a component of uncontrolled GERD that is likely contributing to her symptoms. The other component of her symptoms is the belching that may be related to aerophagia. I encouraged to try to slow down when eating in order reduce the amount of air that is gulped. One final consideration would be whether or not the patient has underlying EoE that is contributing to her symptoms. If she does not respond well to PPI therapy, then could consider EGD in the future. - GERD handout - Start omeprazole 40 mg QD - Recommend eating slower, which could reduce aerophagia - RTC 2 months for a virtual visit  Christia Reading, MD

## 2022-06-04 NOTE — Patient Instructions (Addendum)
If you are age 20 or younger, your body mass index should be between 19-25. Your Body mass index is 27.69 kg/m. If this is out of the aformentioned range listed, please consider follow up with your Primary Care Provider.  ________________________________________________________  The [place] [facility] providers would like to encourage you to use [facility] to communicate with providers for non-urgent requests or questions.  Due to long hold times on the telephone, sending your provider a message by [facility] may be a faster and more efficient way to get a response.  Please allow 48 business hours for a response.  Please remember that this is for non-urgent requests.  _______________________________________________________  We have sent the following medications to your pharmacy for you to pick up at your convenience:  START: omeprazole '40mg'$  one capsule daily.  Take 30 minutes prior to breakfast meal each day.  Please take small bites, chew each bite, and slow down while eating.  You are scheduled to follow up on 08-08-22 at 8:30am.  This will be a virtual appointment.  Thank you for entrusting me with your care and choosing [facility].  Dr Lorenso Courier

## 2022-06-06 ENCOUNTER — Other Ambulatory Visit (HOSPITAL_COMMUNITY): Payer: Self-pay

## 2022-06-06 DIAGNOSIS — F4322 Adjustment disorder with anxiety: Secondary | ICD-10-CM | POA: Diagnosis not present

## 2022-06-06 MED ORDER — ESCITALOPRAM OXALATE 10 MG PO TABS
10.0000 mg | ORAL_TABLET | Freq: Every morning | ORAL | 1 refills | Status: DC
  Filled 2022-06-06 – 2022-06-10 (×3): qty 90, 90d supply, fill #0

## 2022-06-07 ENCOUNTER — Encounter (HOSPITAL_BASED_OUTPATIENT_CLINIC_OR_DEPARTMENT_OTHER): Payer: Self-pay | Admitting: Obstetrics & Gynecology

## 2022-06-07 ENCOUNTER — Other Ambulatory Visit (HOSPITAL_COMMUNITY): Payer: Self-pay

## 2022-06-07 ENCOUNTER — Ambulatory Visit (INDEPENDENT_AMBULATORY_CARE_PROVIDER_SITE_OTHER): Payer: 59 | Admitting: Obstetrics & Gynecology

## 2022-06-07 VITALS — BP 103/61 | HR 55 | Ht 65.0 in | Wt 165.6 lb

## 2022-06-07 DIAGNOSIS — N6012 Diffuse cystic mastopathy of left breast: Secondary | ICD-10-CM

## 2022-06-07 DIAGNOSIS — N926 Irregular menstruation, unspecified: Secondary | ICD-10-CM

## 2022-06-07 DIAGNOSIS — N3281 Overactive bladder: Secondary | ICD-10-CM | POA: Diagnosis not present

## 2022-06-07 DIAGNOSIS — N946 Dysmenorrhea, unspecified: Secondary | ICD-10-CM | POA: Diagnosis not present

## 2022-06-07 NOTE — Progress Notes (Signed)
GYNECOLOGY  VISIT  CC:   irregular bleeding  HPI: 21 y.o. G0P0000 Single White or Caucasian female here for discussion of irregular bleeding with contraception.  Not SA.  She stopped her OCPs about a week ago.  Hasn't had any bleeding yet but was not at the end of the pack.  She does typically have cramping with her cycles and uses motrin.  We discussed alternating motrin and tylenol as well.  She does not feels she needs a prescription for this.  She does also have some intermittent left breast pain.  This is not present all of the time.  No nipple discharge.  She is having some continued issues with urinary urgency.  For example, at night she will need to void 4 times before going to bed.  Never has a full void.  IC discussed.  Elimination diet/foods to avoid discussed.     Past Medical History:  Diagnosis Date   Anxiety    Pelvic pain    LLQ   Wears glasses for reading     MEDS:   Current Outpatient Medications on File Prior to Visit  Medication Sig Dispense Refill   escitalopram (LEXAPRO) 10 MG tablet Take 1 tablet (10 mg total) by mouth in the morning. 90 tablet 1   ibuprofen (ADVIL) 800 MG tablet Take 1 tablet by mouth every 8 hours as needed. 30 tablet 0   omeprazole (PRILOSEC) 40 MG capsule Take 1 capsule (40 mg total) by mouth daily. 30 capsule 3   No current facility-administered medications on file prior to visit.    ALLERGIES: Patient has no allergy information on record.  SH:  single, non smoker  Review of Systems  All other systems reviewed and are negative.   PHYSICAL EXAMINATION:    BP 103/61 (BP Location: Right Arm, Patient Position: Sitting, Cuff Size: Normal)   Pulse (!) 55   Ht '5\' 5"'$  (1.651 m) Comment: Reported  Wt 165 lb 9.6 oz (75.1 kg)   LMP 05/19/2022 (Approximate)   BMI 27.56 kg/m     General appearance: alert, cooperative and appears stated age Breast:  left breast with fibrocystic changes where she has her breast pain.  No  masses.   Assessment/Plan: 1. Irregular periods/menstrual cycles - pt has stopped her pill.  She is going to monitor and just stay in touch about her bleeding.  2. Dysmenorrhea - will [place] OTC motrin and tylenol  3. Fibrocystic changes of left breast  4.  OAB - options for treatment discussed - trigger foods discussed and information given - medications discussed

## 2022-06-11 ENCOUNTER — Other Ambulatory Visit (HOSPITAL_COMMUNITY): Payer: Self-pay

## 2022-06-27 DIAGNOSIS — F909 Attention-deficit hyperactivity disorder, unspecified type: Secondary | ICD-10-CM | POA: Diagnosis not present

## 2022-06-27 DIAGNOSIS — F419 Anxiety disorder, unspecified: Secondary | ICD-10-CM | POA: Diagnosis not present

## 2022-07-09 DIAGNOSIS — F419 Anxiety disorder, unspecified: Secondary | ICD-10-CM | POA: Diagnosis not present

## 2022-07-09 DIAGNOSIS — F909 Attention-deficit hyperactivity disorder, unspecified type: Secondary | ICD-10-CM | POA: Diagnosis not present

## 2022-07-10 ENCOUNTER — Other Ambulatory Visit (HOSPITAL_COMMUNITY): Payer: Self-pay

## 2022-07-10 MED ORDER — METHYLPHENIDATE HCL 5 MG PO TABS
15.0000 mg | ORAL_TABLET | Freq: Every day | ORAL | 0 refills | Status: DC
  Filled 2022-07-10 – 2022-07-11 (×2): qty 90, 30d supply, fill #0

## 2022-07-11 ENCOUNTER — Other Ambulatory Visit (HOSPITAL_COMMUNITY): Payer: Self-pay

## 2022-07-12 DIAGNOSIS — H5213 Myopia, bilateral: Secondary | ICD-10-CM | POA: Diagnosis not present

## 2022-07-18 DIAGNOSIS — M545 Low back pain, unspecified: Secondary | ICD-10-CM | POA: Diagnosis not present

## 2022-07-27 DIAGNOSIS — Z20818 Contact with and (suspected) exposure to other bacterial communicable diseases: Secondary | ICD-10-CM | POA: Diagnosis not present

## 2022-07-27 DIAGNOSIS — J029 Acute pharyngitis, unspecified: Secondary | ICD-10-CM | POA: Diagnosis not present

## 2022-07-31 ENCOUNTER — Other Ambulatory Visit (HOSPITAL_COMMUNITY): Payer: Self-pay

## 2022-08-08 ENCOUNTER — Telehealth (INDEPENDENT_AMBULATORY_CARE_PROVIDER_SITE_OTHER): Payer: Commercial Managed Care - PPO | Admitting: Internal Medicine

## 2022-08-08 ENCOUNTER — Other Ambulatory Visit: Payer: Self-pay

## 2022-08-08 ENCOUNTER — Other Ambulatory Visit (HOSPITAL_COMMUNITY): Payer: Self-pay

## 2022-08-08 ENCOUNTER — Telehealth: Payer: Commercial Managed Care - PPO | Admitting: Internal Medicine

## 2022-08-08 ENCOUNTER — Encounter: Payer: Self-pay | Admitting: Internal Medicine

## 2022-08-08 DIAGNOSIS — K219 Gastro-esophageal reflux disease without esophagitis: Secondary | ICD-10-CM

## 2022-08-08 DIAGNOSIS — R070 Pain in throat: Secondary | ICD-10-CM | POA: Diagnosis not present

## 2022-08-08 MED ORDER — OMEPRAZOLE 40 MG PO CPDR
40.0000 mg | DELAYED_RELEASE_CAPSULE | Freq: Every day | ORAL | 1 refills | Status: DC
  Filled 2022-08-08 – 2022-08-24 (×2): qty 90, 90d supply, fill #0
  Filled 2022-11-17: qty 90, 90d supply, fill #1

## 2022-08-08 MED ORDER — OMEPRAZOLE 40 MG PO CPDR
40.0000 mg | DELAYED_RELEASE_CAPSULE | Freq: Every day | ORAL | 5 refills | Status: DC
  Filled 2022-08-08: qty 30, 30d supply, fill #0

## 2022-08-08 NOTE — Progress Notes (Signed)
   Chief Complaint: GERD, N&V  HPI : 22 year old female with history of anxiety presents for follow up of GERD and N&V  Interval History: Patient's symptoms have improved significantly on daily omeprazole therapy. She no longer has any throat burning or regurgitation. She has tried to cut down on her caffeine intake and has been eating slower. Otherwise she has not had to readjust her diet significant on the omeprazole therapy. 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   Current Outpatient Medications  Medication Sig Dispense Refill   escitalopram (LEXAPRO) 10 MG tablet Take 1 tablet (10 mg total) by mouth in the morning. 90 tablet 1   methylphenidate (RITALIN) 5 MG tablet Take 3 tablets (15 mg total) by mouth daily. 90 tablet 0   omeprazole (PRILOSEC) 40 MG capsule Take 1 capsule (40 mg total) by mouth daily. 30 capsule 3   No current facility-administered medications for this visit.   Physical Exam: A physical exam was not performed as part of her video visit.  Labs 10/2021: CBC unremarkable. Vit B12 nml.   Ab U/S 07/05/20: IMPRESSION: Negative ultrasound of the LEFT lower quadrant abdominal wall  MRI Pelvis w/contrast 03/21/21: IMPRESSION: 2.6 cm left ovarian simple-appearing cyst, most likely physiologic in a reproductive age female. No follow-up imaging is recommended. Reference: JACR 2020 Feb;17(2):248-254 No other significant abnormality identified.  ASSESSMENT AND PLAN: GERD Throat burning N&V Belching Patient presents for follow up throat burning and regurgitation, both of which have improved significantly on daily omeprazole therapy. Thus I suspect that her symptoms are primarily due to uncontrolled GERD. She has tried to eat slower, which has also helped with her belching.  - Previously gave GERD handout - Cont omeprazole 40 mg QD. Refill.  - Previously recommended eating slower, which can reduce aerophagia - RTC 5 months with virtual  visit  Christia Reading, MD  Virtual Visit I connected with Christine Estes on 08/08/22 at  8:30 AM EST by a video enabled telemedicine application and verified that I am speaking with the correct person using two identifiers.

## 2022-08-08 NOTE — Patient Instructions (Signed)
_______________________________________________________  If your blood pressure at your visit was 140/90 or greater, please contact your primary care physician to follow up on this. _______________________________________________________  If you are age 21 or younger, your body mass index should be between 19-25. Your There is no height or weight on file to calculate BMI. If this is out of the aformentioned range listed, please consider follow up with your Primary Care Provider.  ________________________________________________________  The [place] [facility] providers would like to encourage you to use [facility] to communicate with providers for non-urgent requests or questions.  Due to long hold times on the telephone, sending your provider a message by [facility] may be a faster and more efficient way to get a response.  Please allow 48 business hours for a response.  Please remember that this is for non-urgent requests.  _______________________________________________________  We have sent the following medications to your pharmacy for you to pick up at your convenience:  CONTINUE: omeprazole 53m one capsule daily  You will need a virtual follow up appointment in 5 months (July 2024).  We will contact you to schedule this appointment.  Thank you for entrusting me with your care and [facility]  Dr DLorenso Courier

## 2022-08-15 DIAGNOSIS — F909 Attention-deficit hyperactivity disorder, unspecified type: Secondary | ICD-10-CM | POA: Diagnosis not present

## 2022-08-15 DIAGNOSIS — F419 Anxiety disorder, unspecified: Secondary | ICD-10-CM | POA: Diagnosis not present

## 2022-08-25 ENCOUNTER — Other Ambulatory Visit (HOSPITAL_COMMUNITY): Payer: Self-pay

## 2022-08-26 ENCOUNTER — Other Ambulatory Visit: Payer: Self-pay

## 2022-09-03 DIAGNOSIS — F429 Obsessive-compulsive disorder, unspecified: Secondary | ICD-10-CM | POA: Diagnosis not present

## 2022-09-16 ENCOUNTER — Other Ambulatory Visit (HOSPITAL_COMMUNITY): Payer: Self-pay

## 2022-09-17 ENCOUNTER — Other Ambulatory Visit (HOSPITAL_COMMUNITY): Payer: Self-pay

## 2022-09-18 ENCOUNTER — Other Ambulatory Visit (HOSPITAL_COMMUNITY): Payer: Self-pay

## 2022-09-18 MED ORDER — SERTRALINE HCL 25 MG PO TABS
50.0000 mg | ORAL_TABLET | Freq: Every day | ORAL | 0 refills | Status: DC
  Filled 2022-09-18: qty 180, 90d supply, fill #0

## 2022-09-24 DIAGNOSIS — F429 Obsessive-compulsive disorder, unspecified: Secondary | ICD-10-CM | POA: Diagnosis not present

## 2022-10-01 DIAGNOSIS — F909 Attention-deficit hyperactivity disorder, unspecified type: Secondary | ICD-10-CM | POA: Diagnosis not present

## 2022-10-01 DIAGNOSIS — F419 Anxiety disorder, unspecified: Secondary | ICD-10-CM | POA: Diagnosis not present

## 2022-10-21 ENCOUNTER — Ambulatory Visit (INDEPENDENT_AMBULATORY_CARE_PROVIDER_SITE_OTHER): Payer: Commercial Managed Care - PPO | Admitting: Family Medicine

## 2022-10-21 ENCOUNTER — Encounter: Payer: Self-pay | Admitting: Family Medicine

## 2022-10-21 VITALS — BP 100/62 | HR 66 | Temp 98.2°F | Resp 16 | Ht 65.0 in | Wt 173.1 lb

## 2022-10-21 DIAGNOSIS — Z0000 Encounter for general adult medical examination without abnormal findings: Secondary | ICD-10-CM | POA: Diagnosis not present

## 2022-10-21 NOTE — Progress Notes (Signed)
Phone [phone]   Subjective:   Patient is a 22 y.o. female presenting for annual physical.    Chief Complaint  Patient presents with   Annual Exam    CPE Having a lot of dizziness, started 1 week ago, just got off of Zoloft, wondering if it is due to that Not fasting    Annual ADHD-pysch-ritalin not feel good so stopped Anxiety-switched Lexapro to Zoloft-March-not feel food on it-manic. So stopped/weaned and done 1 week(s) ago. 50 to 25 to 12.5.  dizziness now-if moves head or stand up.  This is new.  No  nausea and vomiting.  Occasional headache(s).   Heart beating fast going up stairs this past week(s) and legs feel "heartbeat" when walking up stairs.    See problem oriented charting- ROS- ROS: Gen: no fever, chills  Skin: no rash, itching ENT: no ear pain, ear drainage, nasal congestion, rhinorrhea, sinus pressure, sore throat Eyes: no blurry vision, double vision Resp: no cough, wheeze,SOB CV: no LE edema,  drinking plenty of water and electrolytes.   GI: no heartburn, n/v/d/c, abd pain GU: no dysuria, urgency, frequency, hematuria.  Menses reg MSK: no joint pain, myalgias, back pain Neuro: no dizziness, headache, weakness, vertigo Psych: ok.  No SI  The following were reviewed and entered/updated in epic: Past Medical History:  Diagnosis Date   Anxiety    Pelvic pain    LLQ   Wears glasses for reading    Patient Active Problem List   Diagnosis Date Noted   Major depressive disorder, recurrent episode (HCC) 03/07/2021   LLQ pain 11/02/2020   Acne    Neck pain 08/17/2019   Reactive lymphadenopathy 05/28/2019   Abnormal thyroid function test 02/05/2018   Fatigue 02/05/2018   Past Surgical History:  Procedure Laterality Date   LAPAROSCOPY N/A 08/22/2021   Procedure: LAPAROSCOPY DIAGNOSTIC;  Surgeon: Jerene Bears, MD;  Location: [facility];  Service: Gynecology;  Laterality: N/A;   MASS EXCISION N/A 08/03/2020   Procedure: EXCISION ABDOMINAL  WALL MASS;  Surgeon: Emelia Loron, MD;  Location: [facility];  Service: General;  Laterality: N/A;    Family History  Problem Relation Age of Onset   Endometriosis Mother    Ulcerative colitis Mother    Other Father        Eosinophilic esophagitis   Thyroid disease Maternal Grandmother    Cancer Maternal Grandmother    Heart disease Paternal Grandfather    Thyroid disease Other    Colon cancer Neg Hx    Colon polyps Neg Hx    Esophageal cancer Neg Hx    Pancreatic cancer Neg Hx    Stomach cancer Neg Hx     Medications- reviewed and updated Current Outpatient Medications  Medication Sig Dispense Refill   methylphenidate (RITALIN) 5 MG tablet Take 3 tablets (15 mg total) by mouth daily. (Patient not taking: Reported on 10/21/2022) 90 tablet 0   omeprazole (PRILOSEC) 40 MG capsule Take 1 capsule (40 mg total) by mouth daily. (Patient not taking: Reported on 10/21/2022) 90 capsule 1   sertraline (ZOLOFT) 25 MG tablet Take 2 tablets (50 mg total) by mouth daily. (Patient not taking: Reported on 10/21/2022) 180 tablet 0   No current facility-administered medications for this visit.    Allergies-reviewed and updated No Known Allergies  Social History   Social History Narrative   In college-[employer] in [place].  [employer]ess [employer]   Objective  Objective:  BP 100/62   Pulse  66   Temp 98.2 F (36.8 C) (Temporal)   Resp 16   Ht 5\' 5"  (1.651 m)   Wt 173 lb 2 oz (78.5 kg)   LMP 10/01/2022 (Exact Date)   SpO2 98%   BMI 28.81 kg/m  Physical Exam  Gen: WDWN NAD HEENT: NCAT, conjunctiva not injected, sclera nonicteric TM WNL B, OP moist, no exudates  NECK:  supple, no thyromegaly, no nodes, no carotid bruits CARDIAC: RRR, S1S2+, no murmur. DP 2+B LUNGS: CTAB. No wheezes ABDOMEN:  BS+, soft, NTND, No HSM, no masses EXT:  no edema MSK: no gross abnormalities. MS 5/5 all 4 NEURO: A&O x3.  CN II-XII intact.  PSYCH: normal mood. Good eye contact       Assessment and Plan   Health Maintenance counseling: 1. Anticipatory guidance: Patient counseled regarding regular dental exams q6 months, eye exams,  avoiding smoking and second hand smoke, limiting alcohol to 1 beverage per day, no illicit drugs.   2. Risk factor reduction:  Advised patient of need for regular exercise and diet rich and fruits and vegetables to reduce risk of heart attack and stroke. 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 .  Wt Readings from Last 3 Encounters:  10/21/22 173 lb 2 oz (78.5 kg)  06/07/22 165 lb 9.6 oz (75.1 kg)  06/04/22 166 lb 6 oz (75.5 kg)   3. Immunizations/screenings/ancillary studies Immunization History  Administered Date(s) Administered   Dtap, Unspecified 06/29/2002, 08/27/2002, 10/29/2002, 08/05/2003, 05/06/2006   H1N1 07/08/2008, 08/10/2008   HIB (PRP-OMP) 06/29/2002, 08/27/2002, 10/29/2002, 08/05/2003   HPV 9-valent 02/23/2014, 06/22/2014   HPV Quadrivalent 12/27/2013   Hepatitis A 05/06/2006, 07/09/2007   Hepatitis B 2002/01/25, 06/29/2002, 01/28/2003   IPV 06/29/2002, 08/27/2002, 01/28/2003, 05/06/2006   Influenza Nasal 05/06/2006, 05/07/2007, 04/14/2008, 04/09/2009, 04/13/2010, 03/03/2011   Influenza,Quad,Nasal, Live 04/25/2012, 04/22/2013, 04/21/2014   Influenza,inj,Quad PF,6+ Mos 03/21/2015, 03/19/2016, 03/18/2017, 04/13/2018, 03/25/2019, 03/25/2020, 03/23/2021, 03/15/2022   Influenza-Unspecified 03/29/2003, 04/29/2003, 03/19/2005   MMR 04/29/2003, 05/06/2006   Meningococcal B, OMV 08/28/2018, 01/11/2021   Meningococcal Conjugate 12/17/2013, 08/28/2018   PFIZER(Purple Top)SARS-COV-2 Vaccination 09/02/2019, 09/24/2019, 06/03/2020   PPD Test 05/16/2021   Pneumococcal-Unspecified 06/29/2002, 09/08/2002, 01/28/2003, 08/05/2003   Tdap 12/17/2013   Varicella 04/29/2003, 05/06/2006   Health Maintenance Due  Topic Date Due   HIV Screening  Never done   Hepatitis C Screening  Never done    4. Cervical cancer screening: N/A-not S 5.  Skin cancer screening- advised regular sunscreen use. Denies worrisome, changing, or new skin lesions.  6. Birth control/STD check: n/a 7. Smoking associated screening: non smoker 8. Alcohol screening: neg  Wellness examination   Wellness-anticipatory guidance.  Work on Diet/Exercise  Check CBC,CMP,lipids,TSH, A1C.  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  Could be viral/allergic other.  Patient not want treatment(s) Palpitations-?autonomic dysfunction from withdrawals to SSRI, other.  Check labs.  Monitor for now.    Recommended follow up: No follow-ups on file.  Lab/Order associations:non fasting   Angelena Sole, MD

## 2022-10-21 NOTE — Patient Instructions (Signed)
It was very nice to see you today!  exercises   PLEASE NOTE:  If you had any lab tests please let us know if you have not heard back within a few days. You may see your results on MyChart before we have a chance to review them but we will give you a call once they are reviewed by [place]. If we ordered any referrals today, please let us know if you have not heard from their office within the next week.   Please try these tips to maintain a healthy lifestyle:  Eat most of your calories during the day when you are active. Eliminate processed foods including packaged sweets (pies, cakes, cookies), reduce intake of potatoes, white bread, white pasta, and white rice. Look for whole grain options, oat flour or almond flour.  Each meal should contain half fruits/vegetables, one quarter protein, and one quarter carbs (no bigger than a computer mouse).  Cut down on sweet beverages. This includes juice, soda, and sweet tea. Also watch fruit intake, though this is a healthier sweet option, it still contains natural sugar! Limit to 3 servings daily.  Drink at least 1 glass of water with each meal and aim for at least 8 glasses per day  Exercise at least 150 minutes every week.

## 2022-10-22 ENCOUNTER — Other Ambulatory Visit (HOSPITAL_COMMUNITY): Payer: Self-pay

## 2022-10-22 DIAGNOSIS — L700 Acne vulgaris: Secondary | ICD-10-CM | POA: Diagnosis not present

## 2022-10-22 LAB — CBC WITH DIFFERENTIAL/PLATELET
Basophils Absolute: 0.1 10*3/uL (ref 0.0–0.1)
Basophils Relative: 0.9 % (ref 0.0–3.0)
Eosinophils Absolute: 0.2 10*3/uL (ref 0.0–0.7)
Eosinophils Relative: 1.8 % (ref 0.0–5.0)
HCT: 37.4 % (ref 36.0–46.0)
Hemoglobin: 12.5 g/dL (ref 12.0–15.0)
Lymphocytes Relative: 32.8 % (ref 12.0–46.0)
Lymphs Abs: 3.4 10*3/uL (ref 0.7–4.0)
MCHC: 33.5 g/dL (ref 30.0–36.0)
MCV: 83.2 fl (ref 78.0–100.0)
Monocytes Absolute: 0.9 10*3/uL (ref 0.1–1.0)
Monocytes Relative: 9.2 % (ref 3.0–12.0)
Neutro Abs: 5.7 10*3/uL (ref 1.4–7.7)
Neutrophils Relative %: 55.3 % (ref 43.0–77.0)
Platelets: 306 10*3/uL (ref 150.0–400.0)
RBC: 4.5 Mil/uL (ref 3.87–5.11)
RDW: 12.8 % (ref 11.5–14.6)
WBC: 10.3 10*3/uL (ref 4.5–10.5)

## 2022-10-22 LAB — LIPID PANEL
Cholesterol: 131 mg/dL (ref 0–200)
HDL: 55.6 mg/dL (ref >39.00)
LDL Cholesterol: 45 mg/dL (ref 0–99)
NonHDL: 75.5
Total CHOL/HDL Ratio: 2
Triglycerides: 154 mg/dL — ABNORMAL HIGH (ref 0.0–149.0)
VLDL: 30.8 mg/dL (ref 0.0–40.0)

## 2022-10-22 LAB — COMPREHENSIVE METABOLIC PANEL
ALT: 21 U/L (ref 0–35)
AST: 23 U/L (ref 0–37)
Albumin: 4.1 g/dL (ref 3.5–5.2)
Alkaline Phosphatase: 65 U/L (ref 39–117)
BUN: 12 mg/dL (ref 6–23)
CO2: 28 mEq/L (ref 19–32)
Calcium: 9.4 mg/dL (ref 8.4–10.5)
Chloride: 102 mEq/L (ref 96–112)
Creatinine, Ser: 0.73 mg/dL (ref 0.40–1.20)
GFR: 118.32 mL/min (ref >60.00)
Glucose, Bld: 77 mg/dL (ref 70–99)
Potassium: 4.1 mEq/L (ref 3.5–5.1)
Sodium: 138 mEq/L (ref 135–145)
Total Bilirubin: 0.4 mg/dL (ref 0.2–1.2)
Total Protein: 7 g/dL (ref 6.0–8.3)

## 2022-10-22 LAB — TSH: TSH: 0.54 u[IU]/mL (ref 0.35–5.50)

## 2022-10-22 LAB — HEMOGLOBIN A1C: Hgb A1c MFr Bld: 5.6 % (ref 4.6–6.5)

## 2022-10-22 MED ORDER — CLINDAMYCIN PHOSPHATE 1 % EX LOTN
1.0000 "application " | TOPICAL_LOTION | Freq: Every day | CUTANEOUS | 11 refills | Status: DC
  Filled 2022-10-22 (×2): qty 60, 30d supply, fill #0
  Filled 2022-11-17: qty 60, 30d supply, fill #1
  Filled 2023-04-17: qty 60, 30d supply, fill #2

## 2022-10-22 NOTE — Progress Notes (Signed)
Labs are normal.  Thyroid on border of possibly being hyper but has been that way for awhile.  If the fast heartbeat keeps occurring, we should repeat TSH,FT3,free T4.

## 2022-10-23 ENCOUNTER — Other Ambulatory Visit (HOSPITAL_COMMUNITY): Payer: Self-pay

## 2022-10-31 DIAGNOSIS — F429 Obsessive-compulsive disorder, unspecified: Secondary | ICD-10-CM | POA: Diagnosis not present

## 2022-11-06 ENCOUNTER — Telehealth: Payer: Self-pay

## 2022-11-06 NOTE — Telephone Encounter (Signed)
-----   Message from Lamona Curl, [place] sent at 08/08/2022  9:09 AM EST ----- Regarding: July 2024 5 month follow up, VIRTUAL visit, GERD, throat burning, YD

## 2022-11-06 NOTE — Telephone Encounter (Signed)
Left message for patient to return call to schedule Virtual visit with Dr Leonides Schanz in July for 5 month follow up re: GERD, throat burning.  Will continue efforts.

## 2022-11-12 ENCOUNTER — Encounter: Payer: 59 | Admitting: Family Medicine

## 2022-11-13 NOTE — Telephone Encounter (Signed)
Patient aware that she is scheduled for MyChart visit with Dr Leonides Schanz on 01-03-23 at 3:40pm.  Patient agreed to plan and verbalized understanding.  No further questions.

## 2022-11-13 NOTE — Telephone Encounter (Signed)
Patient retuning call. Please advise.  Thank you  

## 2022-11-13 NOTE — Telephone Encounter (Signed)
Left message for patient to return call to schedule Virtual visit with Dr Dorsey in July for 5 month follow up re: GERD, throat burning.  Will continue efforts.  

## 2022-11-19 DIAGNOSIS — F419 Anxiety disorder, unspecified: Secondary | ICD-10-CM | POA: Diagnosis not present

## 2022-11-19 DIAGNOSIS — S93491A Sprain of other ligament of right ankle, initial encounter: Secondary | ICD-10-CM | POA: Diagnosis not present

## 2022-11-19 DIAGNOSIS — F909 Attention-deficit hyperactivity disorder, unspecified type: Secondary | ICD-10-CM | POA: Diagnosis not present

## 2022-12-30 DIAGNOSIS — F429 Obsessive-compulsive disorder, unspecified: Secondary | ICD-10-CM | POA: Diagnosis not present

## 2023-01-03 ENCOUNTER — Other Ambulatory Visit (HOSPITAL_COMMUNITY): Payer: Self-pay

## 2023-01-03 ENCOUNTER — Telehealth (INDEPENDENT_AMBULATORY_CARE_PROVIDER_SITE_OTHER): Payer: Commercial Managed Care - PPO | Admitting: Internal Medicine

## 2023-01-03 ENCOUNTER — Encounter: Payer: Self-pay | Admitting: Internal Medicine

## 2023-01-03 DIAGNOSIS — K219 Gastro-esophageal reflux disease without esophagitis: Secondary | ICD-10-CM

## 2023-01-03 MED ORDER — OMEPRAZOLE 40 MG PO CPDR
40.0000 mg | DELAYED_RELEASE_CAPSULE | Freq: Every day | ORAL | 3 refills | Status: AC
  Filled 2023-01-03 – 2023-01-14 (×2): qty 90, 90d supply, fill #0
  Filled 2023-05-25: qty 90, 90d supply, fill #1
  Filled 2023-08-16: qty 90, 90d supply, fill #2
  Filled 2023-11-18: qty 90, 90d supply, fill #3

## 2023-01-03 NOTE — Progress Notes (Signed)
   Chief Complaint: GERD  HPI : 22 year old female with history of anxiety presents for follow up of GERD  Interval History: Patient seems to be doing well on the omeprazole therapy. She still have some noises if she eats too much. Denies regurgitation. She has a little bit of burning, though she is able to deal with this. She is taking her omeprazole once a day before breakfast. Denies N&V, dysphagia, or weight loss. 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 Denies ab pain. Bowel habits have been normal.   Current Outpatient Medications  Medication Sig Dispense Refill   clindamycin (CLEOCIN T) 1 % lotion Apply liberally to affected area once a day 60 mL 11   omeprazole (PRILOSEC) 40 MG capsule Take 1 capsule (40 mg total) by mouth daily. 90 capsule 1   No current facility-administered medications for this visit.   Physical Exam: A physical exam was not performed as part of her video visit.  Labs 10/2021: CBC unremarkable. Vit B12 nml.   Labs 10/2022: CBC nml. CMP nml. TSH nml. HbA1C nml.   Ab U/S 07/05/20: IMPRESSION: Negative ultrasound of the LEFT lower quadrant abdominal wall  MRI Pelvis w/contrast 03/21/21: IMPRESSION: 2.6 cm left ovarian simple-appearing cyst, most likely physiologic in a reproductive age female. No follow-up imaging is recommended. Reference: JACR 2020 Feb;17(2):248-254 No other significant abnormality identified.  ASSESSMENT AND PLAN: GERD Throat burning N&V Patient overall has been doing well with her acid reflux and noises in her throat. The omeprazole seems to be helping with her symptoms significantly, and she is happy with her current regimen. She is taking the medication appropriately before meals. Will maintain her on this regimen. - Previously gave GERD  handout - Cont omeprazole 40 mg QD. Refill.  - RTC 1 year  [name], MD  Virtual Visit I connected with Paul Half on 01/03/23 at  3:40 PM EDT by a video enabled telemedicine application and verified that I am speaking with the correct person using two identifiers.  I spent 20 minutes of time, including independent review of results as outlined above, communicating results with the patient directly, face-to-face time with the patient, coordinating care, ordering studies and medications as appropriate, and documentation.

## 2023-01-03 NOTE — Patient Instructions (Addendum)
We have sent the following medications to your pharmacy for you to pick up at your convenience: Omeprazole  Follow up in 1 year  _______________________________________________________  If your blood pressure at your visit was 140/90 or greater, please contact your primary care physician to follow up on this.  _______________________________________________________  If you are age 21 or older, your body mass index should be between 23-30. Your There is no height or weight on file to calculate BMI. If this is out of the aforementioned range listed, please consider follow up with your Primary Care Provider.  If you are age 40 or younger, your body mass index should be between 19-25. Your There is no height or weight on file to calculate BMI. If this is out of the aformentioned range listed, please consider follow up with your Primary Care Provider.   ________________________________________________________  The [facility] providers would like to encourage you to use [facility] to communicate with providers for non-urgent requests or questions.  Due to long hold times on the telephone, sending your provider a message by [facility] may be a faster and more efficient way to get a response.  Please allow 48 business hours for a response.  Please remember that this is for non-urgent requests.  _______________________________________________________   Thank you for entrusting me with your care and for choosing [facility],  Dr. [name]

## 2023-01-05 ENCOUNTER — Encounter (HOSPITAL_BASED_OUTPATIENT_CLINIC_OR_DEPARTMENT_OTHER): Payer: Self-pay | Admitting: Obstetrics & Gynecology

## 2023-01-06 ENCOUNTER — Other Ambulatory Visit (HOSPITAL_COMMUNITY): Payer: Self-pay

## 2023-01-13 DIAGNOSIS — F909 Attention-deficit hyperactivity disorder, unspecified type: Secondary | ICD-10-CM | POA: Diagnosis not present

## 2023-01-13 DIAGNOSIS — F419 Anxiety disorder, unspecified: Secondary | ICD-10-CM | POA: Diagnosis not present

## 2023-01-14 ENCOUNTER — Other Ambulatory Visit (HOSPITAL_COMMUNITY): Payer: Self-pay

## 2023-01-14 MED ORDER — FLUOXETINE HCL 10 MG PO TABS
10.0000 mg | ORAL_TABLET | Freq: Every day | ORAL | 0 refills | Status: DC
  Filled 2023-01-14: qty 90, 90d supply, fill #0

## 2023-01-15 ENCOUNTER — Other Ambulatory Visit (HOSPITAL_COMMUNITY): Payer: Self-pay

## 2023-01-16 ENCOUNTER — Ambulatory Visit (HOSPITAL_BASED_OUTPATIENT_CLINIC_OR_DEPARTMENT_OTHER): Payer: Commercial Managed Care - PPO | Admitting: Student

## 2023-01-16 ENCOUNTER — Encounter (HOSPITAL_BASED_OUTPATIENT_CLINIC_OR_DEPARTMENT_OTHER): Payer: Self-pay | Admitting: Student

## 2023-01-16 ENCOUNTER — Other Ambulatory Visit (HOSPITAL_COMMUNITY): Payer: Self-pay

## 2023-01-16 VITALS — BP 110/74 | HR 71 | Ht 65.0 in | Wt 179.0 lb

## 2023-01-16 DIAGNOSIS — Z30011 Encounter for initial prescription of contraceptive pills: Secondary | ICD-10-CM | POA: Diagnosis not present

## 2023-01-16 MED ORDER — NORETHIN ACE-ETH ESTRAD-FE 1-20 MG-MCG PO TABS
1.0000 | ORAL_TABLET | Freq: Every day | ORAL | 3 refills | Status: DC
Start: 2023-01-16 — End: 2023-06-19
  Filled 2023-01-16: qty 84, 84d supply, fill #0
  Filled 2023-04-02: qty 84, 84d supply, fill #1
  Filled 2023-05-25 – 2023-05-28 (×2): qty 84, 84d supply, fill #2

## 2023-01-17 DIAGNOSIS — L720 Epidermal cyst: Secondary | ICD-10-CM | POA: Diagnosis not present

## 2023-01-17 DIAGNOSIS — L700 Acne vulgaris: Secondary | ICD-10-CM | POA: Diagnosis not present

## 2023-01-17 DIAGNOSIS — L438 Other lichen planus: Secondary | ICD-10-CM | POA: Diagnosis not present

## 2023-01-17 NOTE — Progress Notes (Signed)
  History:  [name] is a 22 y.o. G0P0000 who presents to clinic today for prescription to restart COC pills.   Having regular periods. Not currently sexually active.  The following portions of the patient's history were reviewed and updated as appropriate: allergies, current medications, family history, past medical history, social history, past surgical history and problem list.  Review of Systems:  Review of Systems  Constitutional: Negative.   Respiratory: Negative.    Cardiovascular: Negative.   Gastrointestinal: Negative.   Genitourinary: Negative.   Neurological: Negative.   Psychiatric/Behavioral: Negative.        Objective:  Physical Exam BP 110/74 (BP Location: Right Arm, Patient Position: Sitting, Cuff Size: Large)   Pulse 71   Ht 5\' 5"  (1.651 m) Comment: Reported  Wt 179 lb (81.2 kg)   BMI 29.79 kg/m  Physical Exam Vitals reviewed.  Constitutional:      Appearance: Normal appearance.  Cardiovascular:     Rate and Rhythm: Normal rate.  Pulmonary:     Effort: Pulmonary effort is normal.  Abdominal:     Palpations: Abdomen is soft.  Genitourinary:    Comments: Not indicated Neurological:     Mental Status: She is alert and oriented to person, place, and time. Mental status is at baseline.  Psychiatric:        Mood and Affect: Mood normal.        Behavior: Behavior normal.     Labs and Imaging No results found for this or any previous visit (from the past 24 hour(s)).  No results found.  Health Maintenance Due  Topic Date Due   INFLUENZA VACCINE  01/16/2023    Labs, imaging and previous visits in Epic and Care Everywhere reviewed  Assessment & Plan:  1. Encounter for initial prescription of contraceptive pills - Counseled on safe management and precautions  - norethindrone-ethinyl estradiol-FE (LOESTRIN FE) 1-20 MG-MCG tablet; Take 1 tablet by mouth daily.  Dispense: 84 tablet; Refill: 3  Instructed to follow-up in 3 months if having  concerns or undesired side-effects.   Approximately 10 minutes of total time was spent with this patient on counseling and coordination of care  No follow-ups on file.  Corlis Hove, NP 01/17/2023 6:57 PM

## 2023-02-24 ENCOUNTER — Other Ambulatory Visit (HOSPITAL_COMMUNITY): Payer: Self-pay

## 2023-02-24 DIAGNOSIS — F419 Anxiety disorder, unspecified: Secondary | ICD-10-CM | POA: Diagnosis not present

## 2023-02-24 DIAGNOSIS — F909 Attention-deficit hyperactivity disorder, unspecified type: Secondary | ICD-10-CM | POA: Diagnosis not present

## 2023-02-24 MED ORDER — FLUOXETINE HCL 20 MG PO TABS
20.0000 mg | ORAL_TABLET | Freq: Every day | ORAL | 0 refills | Status: DC
  Filled 2023-02-24: qty 90, 90d supply, fill #0

## 2023-03-06 DIAGNOSIS — F429 Obsessive-compulsive disorder, unspecified: Secondary | ICD-10-CM | POA: Diagnosis not present

## 2023-04-04 DIAGNOSIS — F429 Obsessive-compulsive disorder, unspecified: Secondary | ICD-10-CM | POA: Diagnosis not present

## 2023-04-14 DIAGNOSIS — F909 Attention-deficit hyperactivity disorder, unspecified type: Secondary | ICD-10-CM | POA: Diagnosis not present

## 2023-04-14 DIAGNOSIS — F419 Anxiety disorder, unspecified: Secondary | ICD-10-CM | POA: Diagnosis not present

## 2023-04-18 ENCOUNTER — Other Ambulatory Visit (HOSPITAL_COMMUNITY): Payer: Self-pay

## 2023-05-20 DIAGNOSIS — F429 Obsessive-compulsive disorder, unspecified: Secondary | ICD-10-CM | POA: Diagnosis not present

## 2023-05-25 ENCOUNTER — Other Ambulatory Visit (HOSPITAL_COMMUNITY): Payer: Self-pay

## 2023-05-26 ENCOUNTER — Other Ambulatory Visit: Payer: Self-pay

## 2023-05-28 ENCOUNTER — Other Ambulatory Visit (HOSPITAL_COMMUNITY): Payer: Self-pay

## 2023-05-28 MED ORDER — FLUOXETINE HCL 20 MG PO TABS
20.0000 mg | ORAL_TABLET | Freq: Every day | ORAL | 0 refills | Status: DC
  Filled 2023-05-28: qty 90, 90d supply, fill #0

## 2023-05-29 ENCOUNTER — Other Ambulatory Visit (HOSPITAL_COMMUNITY): Payer: Self-pay

## 2023-06-05 NOTE — Progress Notes (Unsigned)
[facility] [address] [place] [address] Phone: [phone] Subjective:   INadine Counts, am serving as a scribe for Dr. Antoine Primas.  I'm seeing this patient by the request  of:  Jeani Sow, MD  CC: Neck and shoulder pain  ION:[id number]  Christine Estes is a 22 y.o. female coming in with complaint of shoulder and neck pain. Patient states shoulder blade back pain. Numb on R side. No radiating pain or numbness. This semester is when it started, has gotten worse. Currently playing field hockey. Worse after playing. Sensitive to touch.      Past Medical History:  Diagnosis Date   Anxiety    Pelvic pain    LLQ   Wears glasses for reading    Past Surgical History:  Procedure Laterality Date   LAPAROSCOPY N/A 08/22/2021   Procedure: LAPAROSCOPY DIAGNOSTIC;  Surgeon: Jerene Bears, MD;  [facility];  Service: Gynecology;  Laterality: N/A;   MASS EXCISION N/A 08/03/2020   Procedure: EXCISION ABDOMINAL WALL MASS;  Surgeon: Emelia Loron, MD;  Location: [facility];  Service: General;  Laterality: N/A;   Social History   Socioeconomic History   Marital status: Single    Spouse name: Not on file   Number of children: Not on file   Years of education: Not on file   Highest education level: Not on file  Occupational History   Occupation: Student  Tobacco Use   Smoking status: Never   Smokeless tobacco: Never  Vaping Use   Vaping status: Never Used  Substance and Sexual Activity   Alcohol use: Never   Drug use: Never   Sexual activity: Never  Other Topics Concern   Not on file  Social History Narrative   In college-[employer] in [place]-psych.  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[employer]   Social Drivers of Corporate investment banker Strain: Not on [employer] Insecurity: Not on file  Transportation Needs: Not on file  Physical Activity: Not on file  Stress: Not on file  Social Connections:  Unknown (10/30/2021)   Received from [employer], [facility]   Social Network    Social Network: Not on file   No Known Allergies Family History  Problem Relation Age of Onset   Endometriosis Mother    Ulcerative colitis Mother    Other Father        Eosinophilic esophagitis   Thyroid disease Maternal Grandmother    Cancer Maternal Grandmother    Heart disease Paternal Grandfather    Thyroid disease Other    Colon cancer Neg Hx    Colon polyps Neg Hx    Esophageal cancer Neg Hx    Pancreatic cancer Neg Hx    Stomach cancer Neg Hx     Current Outpatient Medications (Endocrine & Metabolic):    predniSONE (DELTASONE) 20 MG tablet, Take 2 tablets (40 mg total) by mouth daily with breakfast.   norethindrone-ethinyl estradiol-FE (LOESTRIN FE) 1-20 MG-MCG tablet, Take 1 tablet by mouth daily.      Current Outpatient Medications (Other):    clindamycin (CLEOCIN T) 1 % lotion, Apply liberally to affected area once a day (Patient not taking: Reported on 01/16/2023)   FLUoxetine (PROZAC) 10 MG tablet, Take 1 tablet (10 mg total) by mouth daily.   FLUoxetine (PROZAC) 20 MG tablet, Take 1 tablet (20 mg total) by mouth daily.   omeprazole (PRILOSEC) 40 MG capsule, Take 1 capsule (40 mg  total) by mouth daily.    Review of Systems:  No headache, visual changes, nausea, vomiting, diarrhea, constipation, dizziness, abdominal pain, skin rash, fevers, chills, night sweats, weight loss, swollen lymph nodes, body aches, joint swelling, chest pain, shortness of breath, mood changes. POSITIVE muscle aches  Objective  Blood pressure 110/74, pulse 73, height 5\' 5"  (1.651 m), weight 165 lb (74.8 kg), SpO2 98%.   General: No apparent distress alert and oriented x3 mood and affect normal, dressed appropriately.  HEENT: Pupils equal, extraocular movements intact  Respiratory: Patient's speak in full sentences and does not appear short of breath  Cardiovascular: No lower extremity edema, non  tender, no erythema  Right shoulder does have impingement. Does have winging Does have tightness noted with potentially even small mass around the supraspinatus notch.  Negative Spurling's of the neck  Limited muscular skeletal ultrasound was performed and interpreted by Antoine Primas, M   Limited ultrasound shows some hypoechoic changes of the acromioclavicular joint but very minimal.  Questionable bursitis or ganglion cyst in the suprascapular notch noted.  Tightness noted over the left her scapula with compression in the area but does cause more discomfort.    Impression and Recommendations:    The above documentation has been reviewed and is accurate and complete Judi Saa, DO

## 2023-06-10 ENCOUNTER — Ambulatory Visit: Payer: Commercial Managed Care - PPO | Admitting: Family Medicine

## 2023-06-10 ENCOUNTER — Ambulatory Visit (INDEPENDENT_AMBULATORY_CARE_PROVIDER_SITE_OTHER): Payer: Commercial Managed Care - PPO

## 2023-06-10 ENCOUNTER — Encounter: Payer: Self-pay | Admitting: Family Medicine

## 2023-06-10 ENCOUNTER — Other Ambulatory Visit (HOSPITAL_COMMUNITY): Payer: Self-pay

## 2023-06-10 VITALS — BP 110/74 | HR 73 | Ht 65.0 in | Wt 165.0 lb

## 2023-06-10 DIAGNOSIS — M25511 Pain in right shoulder: Secondary | ICD-10-CM

## 2023-06-10 DIAGNOSIS — M542 Cervicalgia: Secondary | ICD-10-CM

## 2023-06-10 DIAGNOSIS — G2589 Other specified extrapyramidal and movement disorders: Secondary | ICD-10-CM | POA: Diagnosis not present

## 2023-06-10 MED ORDER — PREDNISONE 20 MG PO TABS
40.0000 mg | ORAL_TABLET | Freq: Every day | ORAL | 0 refills | Status: DC
  Filled 2023-06-10: qty 10, 5d supply, fill #0

## 2023-06-10 NOTE — Assessment & Plan Note (Signed)
Multifactorial, still think that there is some potential bursitis or ganglion cyst in the suprascapular notch is potentially contributing.  Seems to have more of a potential impingement of the suprascapular nerve that is potentially contributing to some of the weakness and weaning we noted today.  Discussed with patient that we will give some exercises, steroids, worsening pain advanced imaging would be warranted.

## 2023-06-10 NOTE — Assessment & Plan Note (Addendum)
Severe scapular dyskinesis with some mild winging noted.  Concern for more of a subscapularis nerve impingement.  Patient on ultrasound does have findings that is consistent with a potential bursitis or even ganglion cyst of the  suprascapular notch.  Patient given exercises, short course of prednisone.  May need to consider the possibility of laboratory workup and advanced imaging if this continues to give her difficulty.  Has had reactive lymphadenopathy previously.  Awaiting further read of the x-rays.  Follow-up with me again in 2 to 3 weeks otherwise.  Will be going back to [place] for school in the next coming weeks prednisone 40 mg daily given as well.

## 2023-06-10 NOTE — Patient Instructions (Addendum)
Xray today Prednisone 40mg  5 days Do prescribed exercises at least 3x a week [address] consult Sat or Sunday Likely bursitis in spinoglenoid notch

## 2023-06-16 ENCOUNTER — Encounter (HOSPITAL_BASED_OUTPATIENT_CLINIC_OR_DEPARTMENT_OTHER): Payer: Self-pay | Admitting: Obstetrics & Gynecology

## 2023-06-19 ENCOUNTER — Encounter (HOSPITAL_BASED_OUTPATIENT_CLINIC_OR_DEPARTMENT_OTHER): Payer: Self-pay | Admitting: Obstetrics & Gynecology

## 2023-06-19 ENCOUNTER — Other Ambulatory Visit (HOSPITAL_COMMUNITY)
Admission: RE | Admit: 2023-06-19 | Discharge: 2023-06-19 | Disposition: A | Discharge status: Home / Self Care | Payer: Commercial Managed Care - PPO | Source: Ambulatory Visit | Attending: Obstetrics & Gynecology | Admitting: Obstetrics & Gynecology

## 2023-06-19 ENCOUNTER — Ambulatory Visit (INDEPENDENT_AMBULATORY_CARE_PROVIDER_SITE_OTHER): Payer: Commercial Managed Care - PPO | Admitting: Obstetrics & Gynecology

## 2023-06-19 ENCOUNTER — Other Ambulatory Visit (HOSPITAL_COMMUNITY): Payer: Self-pay

## 2023-06-19 VITALS — BP 122/70 | HR 81 | Ht 65.0 in | Wt 167.2 lb

## 2023-06-19 DIAGNOSIS — Z124 Encounter for screening for malignant neoplasm of cervix: Secondary | ICD-10-CM | POA: Insufficient documentation

## 2023-06-19 DIAGNOSIS — R790 Abnormal level of blood mineral: Secondary | ICD-10-CM

## 2023-06-19 DIAGNOSIS — Z01419 Encounter for gynecological examination (general) (routine) without abnormal findings: Secondary | ICD-10-CM

## 2023-06-19 DIAGNOSIS — Z30011 Encounter for initial prescription of contraceptive pills: Secondary | ICD-10-CM

## 2023-06-19 MED ORDER — NORETHIN ACE-ETH ESTRAD-FE 1-20 MG-MCG PO TABS
1.0000 | ORAL_TABLET | Freq: Every day | ORAL | 3 refills | Status: DC
  Filled 2023-06-19: qty 84, 84d supply, fill #0
  Filled 2023-09-10: qty 84, 84d supply, fill #1

## 2023-06-19 NOTE — Progress Notes (Signed)
 23 y.o. G0P0000 Single White or Caucasian female here for annual exam.  Has hx of LLQ pain.  This still present.  This does hurt more when she runs and she is doing into the conditioning time of her school year.  She is nervous that this is going to worsen.  She has done PT.  Has excision of abdominal wall lipoma.  Has seen an orthopedic provider in 2022.  Laparoscopy was 08/2021 and this was negative for endometriosis.    She is on OCPs for cycle control.  She did have a lot of spotting when she first started on this but this seems to have resolved.  She does want to stay on this.    Is a holiday representative.  Considering masters or phD after taking a gap year.    No LMP recorded. (Menstrual status: Oral contraceptives).          Sexually active: not currently The current method of family planning is OCP (estrogen/progesterone).    Smoker:  no  Health Maintenance: Pap:  obtained today MMG:  guidelines reviewed Colonoscopy:  guidelines reviewed Screening Labs: ferritin ordered today   reports that she has never smoked. She has never used smokeless tobacco. She reports that she does not drink alcohol and does not use drugs.  Past Medical History:  Diagnosis Date   Anxiety    Pelvic pain    LLQ   Wears glasses for reading     Past Surgical History:  Procedure Laterality Date   LAPAROSCOPY N/A 08/22/2021   Procedure: LAPAROSCOPY DIAGNOSTIC;  Surgeon: Cleotilde Ronal RAMAN, MD;  Location: [facility];  Service: Gynecology;  Laterality: N/A;   MASS EXCISION N/A 08/03/2020   Procedure: EXCISION ABDOMINAL WALL MASS;  Surgeon: Ebbie Cough, MD;  Location: [facility];  Service: General;  Laterality: N/A;    Current Outpatient Medications  Medication Sig Dispense Refill   FLUoxetine  (PROZAC ) 20 MG tablet Take 1 tablet (20 mg total) by mouth daily. 90 tablet 0   omeprazole  (PRILOSEC) 40 MG capsule Take 1 capsule (40 mg total) by mouth daily. 90 capsule 3    norethindrone -ethinyl estradiol -FE (LOESTRIN  FE) 1-20 MG-MCG tablet Take 1 tablet by mouth daily. 84 tablet 3   No current facility-administered medications for this visit.    Family History  Problem Relation Age of Onset   Endometriosis Mother    Ulcerative colitis Mother    Other Father        Eosinophilic esophagitis   Thyroid  disease Maternal Grandmother    Cancer Maternal Grandmother    Heart disease Paternal Grandfather    Thyroid  disease Other    Colon cancer Neg Hx    Colon polyps Neg Hx    Esophageal cancer Neg Hx    Pancreatic cancer Neg Hx    Stomach cancer Neg Hx     ROS: Constitutional: negative Genitourinary: LLQ pain  Exam:   BP 122/70 (BP Location: Right Arm, Patient Position: Sitting, Cuff Size: Normal)   Pulse 81   Ht 5' 5 (1.651 m)   Wt 167 lb 3.2 oz (75.8 kg)   BMI 27.82 kg/m   Height: 5' 5 (165.1 cm)  General appearance: alert, cooperative and appears stated age Head: Normocephalic, without obvious abnormality, atraumatic Neck: no adenopathy, supple, symmetrical, trachea midline and thyroid  normal to inspection and palpation Lungs: clear to auscultation bilaterally Breasts: normal appearance, no masses or tenderness Heart: regular rate and rhythm Abdomen: soft, non-tender; bowel sounds normal; no masses,  no organomegaly Extremities: extremities normal, atraumatic, no cyanosis or edema Skin: Skin color, texture, turgor normal. No rashes or lesions Lymph nodes: Cervical, supraclavicular, and axillary nodes normal. No abnormal inguinal nodes palpated Neurologic: Grossly normal   Pelvic: External genitalia:  no lesions              Urethra:  normal appearing urethra with no masses, tenderness or lesions              Bartholins and Skenes: normal                 Vagina: normal appearing vagina with normal color and no discharge, no lesions              Cervix: no lesions              Pap taken: No. Bimanual Exam:  Uterus:  normal size, contour,  position, consistency, mobility, non-tender              Adnexa: normal adnexa and no mass, fullness, tenderness               Rectovaginal: Confirms               Anus:  normal sphincter tone, no lesions  Chaperone, Bascom Kotyk, CMA, was present for exam.  Assessment/Plan: 1. Well woman exam with routine gynecological exam (Primary) - Pap smear obtained today - Mammogram guidelines reviewed - Colonoscopy guidelines reviewed - lab work done with PCP - vaccines reviewed/updated  2. Encounter for initial prescription of contraceptive pills - norethindrone -ethinyl estradiol -FE (LOESTRIN  FE) 1-20 MG-MCG tablet; Take 1 tablet by mouth daily.  Dispense: 84 tablet; Refill: 3  3. Low ferritin - Ferritin  4. Cervical cancer screening - Cytology - PAP( [facility] [place])

## 2023-06-20 ENCOUNTER — Other Ambulatory Visit (HOSPITAL_COMMUNITY): Payer: Self-pay

## 2023-06-20 DIAGNOSIS — R790 Abnormal level of blood mineral: Secondary | ICD-10-CM | POA: Diagnosis not present

## 2023-06-21 LAB — FERRITIN: Ferritin: 80 ng/mL (ref 15–150)

## 2023-06-24 LAB — CYTOLOGY - PAP: Diagnosis: NEGATIVE

## 2023-07-04 DIAGNOSIS — F429 Obsessive-compulsive disorder, unspecified: Secondary | ICD-10-CM | POA: Diagnosis not present

## 2023-07-04 DIAGNOSIS — F909 Attention-deficit hyperactivity disorder, unspecified type: Secondary | ICD-10-CM | POA: Diagnosis not present

## 2023-07-04 DIAGNOSIS — F419 Anxiety disorder, unspecified: Secondary | ICD-10-CM | POA: Diagnosis not present

## 2023-07-24 DIAGNOSIS — F419 Anxiety disorder, unspecified: Secondary | ICD-10-CM | POA: Diagnosis not present

## 2023-07-24 DIAGNOSIS — F909 Attention-deficit hyperactivity disorder, unspecified type: Secondary | ICD-10-CM | POA: Diagnosis not present

## 2023-08-04 DIAGNOSIS — F429 Obsessive-compulsive disorder, unspecified: Secondary | ICD-10-CM | POA: Diagnosis not present

## 2023-08-16 ENCOUNTER — Other Ambulatory Visit (HOSPITAL_COMMUNITY): Payer: Self-pay

## 2023-08-18 ENCOUNTER — Other Ambulatory Visit: Payer: Self-pay

## 2023-08-18 ENCOUNTER — Other Ambulatory Visit (HOSPITAL_COMMUNITY): Payer: Self-pay

## 2023-08-18 MED ORDER — FLUOXETINE HCL 20 MG PO TABS
20.0000 mg | ORAL_TABLET | Freq: Every day | ORAL | 0 refills | Status: DC
  Filled 2023-08-18 (×2): qty 90, 90d supply, fill #0

## 2023-09-02 DIAGNOSIS — F429 Obsessive-compulsive disorder, unspecified: Secondary | ICD-10-CM | POA: Diagnosis not present

## 2023-09-15 ENCOUNTER — Encounter (HOSPITAL_BASED_OUTPATIENT_CLINIC_OR_DEPARTMENT_OTHER): Payer: Self-pay | Admitting: Obstetrics & Gynecology

## 2023-09-16 ENCOUNTER — Other Ambulatory Visit (HOSPITAL_COMMUNITY): Payer: Self-pay

## 2023-09-16 ENCOUNTER — Other Ambulatory Visit (HOSPITAL_BASED_OUTPATIENT_CLINIC_OR_DEPARTMENT_OTHER): Payer: Self-pay | Admitting: Obstetrics & Gynecology

## 2023-09-16 DIAGNOSIS — Z30011 Encounter for initial prescription of contraceptive pills: Secondary | ICD-10-CM

## 2023-09-16 MED ORDER — DROSPIRENONE-ETHINYL ESTRADIOL 3-0.02 MG PO TABS
1.0000 | ORAL_TABLET | Freq: Every day | ORAL | 1 refills | Status: AC
  Filled 2023-09-16: qty 84, 84d supply, fill #0

## 2023-09-17 ENCOUNTER — Other Ambulatory Visit (HOSPITAL_COMMUNITY): Payer: Self-pay

## 2023-10-07 DIAGNOSIS — F909 Attention-deficit hyperactivity disorder, unspecified type: Secondary | ICD-10-CM | POA: Diagnosis not present

## 2023-10-07 DIAGNOSIS — F419 Anxiety disorder, unspecified: Secondary | ICD-10-CM | POA: Diagnosis not present

## 2023-10-10 DIAGNOSIS — F429 Obsessive-compulsive disorder, unspecified: Secondary | ICD-10-CM | POA: Diagnosis not present

## 2023-10-22 ENCOUNTER — Encounter: Payer: Commercial Managed Care - PPO | Admitting: Family Medicine

## 2023-11-07 DIAGNOSIS — F429 Obsessive-compulsive disorder, unspecified: Secondary | ICD-10-CM | POA: Diagnosis not present

## 2023-11-18 ENCOUNTER — Other Ambulatory Visit: Payer: Self-pay

## 2023-11-18 ENCOUNTER — Other Ambulatory Visit (HOSPITAL_COMMUNITY): Payer: Self-pay

## 2023-11-19 ENCOUNTER — Other Ambulatory Visit (HOSPITAL_BASED_OUTPATIENT_CLINIC_OR_DEPARTMENT_OTHER): Payer: Self-pay

## 2023-11-19 ENCOUNTER — Other Ambulatory Visit (HOSPITAL_COMMUNITY): Payer: Self-pay

## 2023-11-19 MED ORDER — FLUOXETINE HCL 20 MG PO TABS
20.0000 mg | ORAL_TABLET | Freq: Every day | ORAL | 0 refills | Status: DC
  Filled 2023-11-19: qty 90, 90d supply, fill #0

## 2023-11-21 ENCOUNTER — Encounter: Payer: Self-pay | Admitting: Family Medicine

## 2023-11-21 ENCOUNTER — Ambulatory Visit (INDEPENDENT_AMBULATORY_CARE_PROVIDER_SITE_OTHER): Admitting: Family Medicine

## 2023-11-21 VITALS — BP 115/66 | HR 80 | Temp 98.1°F | Resp 18 | Ht 65.0 in | Wt 165.4 lb

## 2023-11-21 DIAGNOSIS — G43009 Migraine without aura, not intractable, without status migrainosus: Secondary | ICD-10-CM

## 2023-11-21 DIAGNOSIS — Z0000 Encounter for general adult medical examination without abnormal findings: Secondary | ICD-10-CM | POA: Diagnosis not present

## 2023-11-21 LAB — COMPREHENSIVE METABOLIC PANEL WITH GFR
Albumin: 4.3 (ref 3.5–5.0)
Calcium: 9.4 (ref 8.7–10.7)
EGFR: 103.7
eGFR: 103.65

## 2023-11-21 LAB — LIPID PANEL
Cholesterol: 162 (ref 0–200)
HDL: 64 (ref 35–70)
LDL Cholesterol: 56
Triglycerides: 214 — AB (ref 40–160)

## 2023-11-21 LAB — HEPATIC FUNCTION PANEL
ALT: 13 U/L (ref 7–35)
AST: 16 (ref 13–35)
Alkaline Phosphatase: 59 (ref 25–125)
Bilirubin, Total: 0.4

## 2023-11-21 LAB — BASIC METABOLIC PANEL WITH GFR
BUN: 13 (ref 4–21)
CO2: 30 — AB (ref 13–22)
Chloride: 102 (ref 99–108)
Creatinine: 0.8 (ref 0.5–1.1)
Glucose: 92
Potassium: 4 meq/L (ref 3.5–5.1)
Sodium: 138 (ref 137–147)

## 2023-11-21 LAB — CBC AND DIFFERENTIAL: WBC: 8.2

## 2023-11-21 LAB — CBC: RBC: 4.93 (ref 3.87–5.11)

## 2023-11-21 NOTE — Progress Notes (Signed)
 Phone [phone]   Subjective:   Patient is a 23 y.o. female presenting for annual physical.    Chief Complaint  Patient presents with   Annual Exam    CPE Not fasting Discuss having recent migraines   Cpe-field hockey Ha in eve Discussed the use of AI scribe software for clinical note transcription with the patient, who gave verbal consent to proceed.  History of Present Illness Christine Estes is a 23 year old female who presents with new onset headaches.  She has been experiencing headaches for the past three weeks, occurring every other day, typically in the late afternoon or evening. The headaches are severe, with symptoms including photophobia, throbbing, pulsating, and pressure sensations, primarily located at the frontal area. They do not occur in the morning and are not associated with phonophobia, nausea, vomiting, or visual disturbances. She uses acetaminophen  with caffeine for relief, but it only provides partial relief. She has not experienced headaches prior to this period.  She started taking Yaz for birth control about a month and a half ago, which she suspects might be related to the onset of her headaches. She has a history of irregular menstrual cycles and has been on various birth control pills in the past to help regulate her cycle. Her current cycle is still irregular, with periods occurring every other week for a day or two.  She has a history of anxiety for which she takes Prozac  20 mg daily. She also takes omeprazole  for heartburn. No dizziness, syncope, chest pain, palpitations, dyspnea, gastrointestinal symptoms, or urinary issues. She is physically active, playing field hockey daily, and reports no recent changes in stress levels.  She denies any significant changes in her environment or lifestyle that could contribute to her symptoms, although she recently moved to a new apartment. She consumes one cup of coffee daily and has not noticed any unusual smells or  environmental factors in her new living situation.    See problem oriented charting- ROS- ROS: Gen: no fever, chills  Skin: no rash, itching ENT: no ear pain, ear drainage, nasal congestion, rhinorrhea, sinus pressure, sore throat.  seasonal Eyes: no blurry vision, double vision Resp: no cough, wheeze,SOB CV: no CP, palpitations, LE edema,  GI: no heartburn, n/v/d/c, abd pain GU: no dysuria, urgency, frequency, hematuria MSK: no joint pain, myalgias, back pain Neuro: no dizziness, weakness, vertigo Psych: no depression, anxiety, insomnia, SI   The following were reviewed and entered/updated in epic: Past Medical History:  Diagnosis Date   Anxiety    Pelvic pain    LLQ   Wears glasses for reading    Patient Active Problem List   Diagnosis Date Noted   Scapular dyskinesis 06/10/2023   Major depressive disorder, recurrent episode (HCC) 03/07/2021   LLQ pain 11/02/2020   Acne    Neck pain 08/17/2019   Reactive lymphadenopathy 05/28/2019   Abnormal thyroid  function test 02/05/2018   Fatigue 02/05/2018   Past Surgical History:  Procedure Laterality Date   LAPAROSCOPY N/A 08/22/2021   Procedure: LAPAROSCOPY DIAGNOSTIC;  Surgeon: Lillian Rein, MD;  Location: [facility];  Service: Gynecology;  Laterality: N/A;   MASS EXCISION N/A 08/03/2020   Procedure: EXCISION ABDOMINAL WALL MASS;  Surgeon: Enid Harry, MD;  Location: [facility];  Service: General;  Laterality: N/A;    Family History  Problem Relation Age of Onset   Endometriosis Mother    Ulcerative colitis Mother    Other Father  Eosinophilic esophagitis   Thyroid  disease Maternal Grandmother    Cancer Maternal Grandmother    Heart disease Paternal Grandfather    Thyroid  disease Other    Colon cancer Neg Hx    Colon polyps Neg Hx    Esophageal cancer Neg Hx    Pancreatic cancer Neg Hx    Stomach cancer Neg Hx     Medications- reviewed and updated Current Outpatient  Medications  Medication Sig Dispense Refill   clindamycin  (CLEOCIN  T) 1 % lotion Apply topically.     drospirenone -ethinyl estradiol  (YAZ) 3-0.02 MG tablet Take 1 tablet by mouth daily. 84 tablet 1   FLUoxetine  (PROZAC ) 20 MG tablet Take 1 tablet (20 mg total) by mouth daily. 90 tablet 0   omeprazole  (PRILOSEC) 40 MG capsule Take 1 capsule (40 mg total) by mouth daily. 90 capsule 3   No current facility-administered medications for this visit.    Allergies-reviewed and updated No Known Allergies  Social History   Social History Narrative   In college-[employer] in [place].     Objective  Objective:  BP 115/66   Pulse 80   Temp 98.1 F (36.7 C) (Temporal)   Resp 18   Ht 5\' 5"  (1.651 m)   Wt 165 lb 6 oz (75 kg)   LMP 11/19/2023 (Exact Date)   SpO2 96%   BMI 27.52 kg/m  Physical Exam  Gen: WDWN NAD HEENT: NCAT, conjunctiva not injected, sclera nonicteric TM WNL B, OP moist, no exudates  NECK:  supple, no thyromegaly, no nodes, no carotid bruits CARDIAC: RRR, S1S2+, no murmur. DP 2+B LUNGS: CTAB. No wheezes ABDOMEN:  BS+, soft, NTND, No HSM, no masses EXT:  no edema MSK: no gross abnormalities. MS 5/5 all 4 NEURO: A&O x3.  CN II-XII intact. F-n, ram, pronator drift neg PSYCH: normal mood. Good eye contact      Assessment and Plan   Health Maintenance counseling: 1. Anticipatory guidance: Patient counseled regarding regular dental exams q6 months, eye exams,  avoiding smoking and second hand smoke, limiting alcohol to 1 beverage per day, no illicit drugs.   2. Risk factor reduction:  Advised patient of need for regular exercise and diet rich and fruits and vegetables to reduce risk of heart attack and stroke. Exercise- +.  Wt Readings from Last 3 Encounters:  11/21/23 165 lb 6 oz (75 kg)  06/19/23 167 lb 3.2 oz (75.8 kg)  06/10/23 165 lb (74.8 kg)   3. Immunizations/screenings/ancillary studies Immunization History  Administered Date(s) Administered   Dtap,  Unspecified 06/29/2002, 08/27/2002, 10/29/2002, 08/05/2003, 05/06/2006   H1N1 07/08/2008, 08/10/2008   HIB (PRP-OMP) 06/29/2002, 08/27/2002, 10/29/2002, 08/05/2003   HPV 9-valent 02/23/2014, 06/22/2014   HPV Quadrivalent 12/27/2013   Hepatitis A 05/06/2006, 07/09/2007   Hepatitis B 12/31/01, 06/29/2002, 01/28/2003   IPV 06/29/2002, 08/27/2002, 01/28/2003, 05/06/2006   Influenza Nasal 05/06/2006, 05/07/2007, 04/14/2008, 04/09/2009, 04/13/2010, 03/03/2011   Influenza, Seasonal, Injecte, Preservative Fre 03/17/2023   Influenza,Quad,Nasal, Live 04/25/2012, 04/22/2013, 04/21/2014   Influenza,inj,Quad PF,6+ Mos 03/21/2015, 03/19/2016, 03/18/2017, 04/13/2018, 03/25/2019, 03/25/2020, 03/23/2021, 03/15/2022   Influenza-Unspecified 03/29/2003, 04/29/2003, 03/19/2005   MMR 04/29/2003, 05/06/2006   Meningococcal B, OMV 08/28/2018, 01/11/2021   Meningococcal Conjugate 12/17/2013, 08/28/2018   PFIZER(Purple Top)SARS-COV-2 Vaccination 09/02/2019, 09/24/2019, 06/03/2020   PPD Test 05/16/2021   Pneumococcal-Unspecified 06/29/2002, 09/08/2002, 01/28/2003, 08/05/2003   Tdap 12/17/2013   Varicella 04/29/2003, 05/06/2006   There are no preventive care reminders to display for this patient.  4. Cervical cancer screening: utd 5. Skin cancer screening- advised  regular sunscreen use. Denies worrisome, changing, or new skin lesions.  6. Birth control/STD check: ocp 7. Smoking associated screening: non smoker 8. Alcohol screening: neg  Wellness examination -     Lipid panel -     Comprehensive metabolic panel with GFR -     CBC with Differential/Platelet -     Hemoglobin A1c -     TSH  Migraine without aura and without status migrainosus, not intractable   Wellness-anticipatory guidance.  Work on Diet/Exercise  Check CBC,CMP,lipids,TSH, A1C.  F/u 1 yr  Assessment and Plan Assessment & Plan Migraine   She experiences migraines every other evening for three weeks, with symptoms of photophobia,  throbbing, pulsating, and pressure at the temples. The onset coincides with starting Yaz, suggesting a hormonal cause, but she prefers to continue this birth control. Medication overuse headaches from caffeine-containing analgesics were discussed. Environmental factors and stress are considered insignificant. Progesterone in Yaz may be a cause, and consulting a gynecologist is an option. Maintain a headache log, noting triggers, diet, and hydration. Perform neck and shoulder stretches to alleviate tension. Limit caffeine intake. Consider an ophthalmology consultation if vision issues persist. Take magnesium glycinate 500 mg daily, noting potential side effects like loose stools. Discuss with a gynecologist the possibility of progesterone in Yaz causing migraines. Consider a neurologist referral if symptoms persist.  Menstrual Irregularities   She uses Yaz for birth control and cycle regulation but reports irregular periods with bleeding every other week for one to two days. Prefers to continue the current regimen.  Anxiety   Anxiety is managed with Prozac  20 mg daily. Continues psychiatric management for medication refills.  Gastroesophageal Reflux Disease (GERD)   GERD is managed with omeprazole . No new symptoms reported.  General Health Maintenance   A 23 year old female attending a routine physical examination. She is physically active, plays field hockey, and maintains a healthy lifestyle with no smoking, alcohol, or drug use. She is applying to PhD programs in clinical psychology. Perform blood work to check for anemia or other abnormalities.  Follow-up   Discussed the option of virtual visits for future concerns. Schedule a follow-up appointment in one year. Perform blood work at the lab at the end of the hall.    Recommended follow up: Return in about 1 year (around 11/20/2024) for annual physical.  Lab/Order associations:non fasting   Ellsworth Haas, MD

## 2023-11-21 NOTE — Patient Instructions (Addendum)
 It was very nice to see you today!  Keep log, stretch neck/shoulders Try to limit the caffeine Consider eye doctor  Take magnesium glycinate daily 500  Talk to gyn  Consider neurologist   PLEASE NOTE:  If you had any lab tests please let us  know if you have not heard back within a few days. You may see your results on MyChart before we have a chance to review them but we will give you a call once they are reviewed by us . If we ordered any referrals today, please let us  know if you have not heard from their office within the next week.   Please try these tips to maintain a healthy lifestyle:  Eat most of your calories during the day when you are active. Eliminate processed foods including packaged sweets (pies, cakes, cookies), reduce intake of potatoes, white bread, white pasta, and white rice. Look for whole grain options, oat flour or almond flour.  Each meal should contain half fruits/vegetables, one quarter protein, and one quarter carbs (no bigger than a computer mouse).  Cut down on sweet beverages. This includes juice, soda, and sweet tea. Also watch fruit intake, though this is a healthier sweet option, it still contains natural sugar! Limit to 3 servings daily.  Drink at least 1 glass of water with each meal and aim for at least 8 glasses per day  Exercise at least 150 minutes every week.

## 2023-11-27 ENCOUNTER — Encounter: Payer: Self-pay | Admitting: Family Medicine

## 2023-11-28 ENCOUNTER — Encounter: Payer: Self-pay | Admitting: Family Medicine

## 2023-12-10 ENCOUNTER — Other Ambulatory Visit (HOSPITAL_COMMUNITY): Payer: Self-pay

## 2023-12-10 ENCOUNTER — Telehealth: Payer: Self-pay | Admitting: Family Medicine

## 2023-12-10 ENCOUNTER — Encounter (HOSPITAL_BASED_OUTPATIENT_CLINIC_OR_DEPARTMENT_OTHER): Payer: Self-pay | Admitting: Obstetrics & Gynecology

## 2023-12-10 MED ORDER — NORETHIN ACE-ETH ESTRAD-FE 1-20 MG-MCG(24) PO TABS
1.0000 | ORAL_TABLET | Freq: Every day | ORAL | 11 refills | Status: AC
  Filled 2023-12-10: qty 84, 84d supply, fill #0

## 2023-12-10 NOTE — Addendum Note (Signed)
 Addended byBETHA TAD RACE on: 12/10/2023 10:56 AM   Modules accepted: Orders

## 2023-12-10 NOTE — Telephone Encounter (Signed)
 Spoke with Darice from [facility] and she is faxing over results, she stated that they should be in EPIC, TSH was done on the 11/27/23.

## 2023-12-10 NOTE — Telephone Encounter (Signed)
 Darice with [facility] returned call. Requests to be called at ph# [phone]

## 2023-12-11 ENCOUNTER — Ambulatory Visit: Payer: Self-pay | Admitting: Family Medicine

## 2023-12-11 NOTE — Progress Notes (Signed)
 I am sorry about all the lab issues.  Labs look great.  Let me know if need any more details

## 2023-12-24 DIAGNOSIS — Z1283 Encounter for screening for malignant neoplasm of skin: Secondary | ICD-10-CM | POA: Diagnosis not present

## 2023-12-24 DIAGNOSIS — I781 Nevus, non-neoplastic: Secondary | ICD-10-CM | POA: Diagnosis not present

## 2023-12-24 DIAGNOSIS — L700 Acne vulgaris: Secondary | ICD-10-CM | POA: Diagnosis not present

## 2023-12-24 DIAGNOSIS — L821 Other seborrheic keratosis: Secondary | ICD-10-CM | POA: Diagnosis not present

## 2023-12-25 ENCOUNTER — Other Ambulatory Visit (HOSPITAL_COMMUNITY): Payer: Self-pay

## 2023-12-25 MED ORDER — MINOCYCLINE HCL 100 MG PO CAPS
100.0000 mg | ORAL_CAPSULE | Freq: Every day | ORAL | 3 refills | Status: AC
  Filled 2023-12-25: qty 30, 30d supply, fill #0

## 2024-01-13 DIAGNOSIS — F429 Obsessive-compulsive disorder, unspecified: Secondary | ICD-10-CM | POA: Diagnosis not present

## 2024-01-16 ENCOUNTER — Encounter

## 2024-01-17 ENCOUNTER — Telehealth: Admitting: Nurse Practitioner

## 2024-01-17 DIAGNOSIS — J40 Bronchitis, not specified as acute or chronic: Secondary | ICD-10-CM | POA: Diagnosis not present

## 2024-01-17 MED ORDER — AZITHROMYCIN 250 MG PO TABS: ORAL_TABLET | ORAL | 0 refills | Status: AC

## 2024-01-17 NOTE — Progress Notes (Signed)
 Virtual Visit Consent   Christine Estes, you are scheduled for a virtual visit with a [facility] provider today. Just as with appointments in the office, your consent must be obtained to participate. Your consent will be active for this visit and any virtual visit you may have with one of our providers in the next 365 days. If you have a MyChart account, a copy of this consent can be sent to you electronically.  As this is a virtual visit, video technology does not allow for your provider to perform a traditional examination. This may limit your provider's ability to fully assess your condition. If your provider identifies any concerns that need to be evaluated in person or the need to arrange testing (such as labs, EKG, etc.), we will make arrangements to do so. Although advances in technology are sophisticated, we cannot ensure that it will always work on either your end or our end. If the connection with a video visit is poor, the visit may have to be switched to a telephone visit. With either a video or telephone visit, we are not always able to ensure that we have a secure connection.  By engaging in this virtual visit, you consent to the provision of healthcare and authorize for your insurance to be billed (if applicable) for the services provided during this visit. Depending on your insurance coverage, you may receive a charge related to this service.  I need to obtain your verbal consent now. Are you willing to proceed with your visit today? Christine Estes has provided verbal consent on 01/17/2024 for a virtual visit (video or telephone). Christine LELON Servant, NP  Date: 01/17/2024 4:26 PM   Virtual Visit via Video Note   I, Christine Estes, connected with  Christine Estes  ([id number], 11-16-2000) on 01/17/24 at  4:30 PM EDT by a video-enabled telemedicine application and verified that I am speaking with the correct person using two identifiers.  Location: Patient: Virtual Visit Location Patient:  Home Provider: Virtual Visit Location Provider: Home Office   I discussed the limitations of evaluation and management by telemedicine and the availability of in person appointments. The patient expressed understanding and agreed to proceed.    History of Present Illness: Christine Estes is a 23 y.o. who identifies as a female who was assigned female at birth, and is being seen today for bronchitis.  Christine Estes has been experiencing URI symptoms of sore throat, nasal congestion, chills, dry cough and pain when taking deep breaths. Despite taking nyquil and OTC decongestant her symptoms have not improved over the past 4days.    Problems:  Patient Active Problem List   Diagnosis Date Noted   Scapular dyskinesis 06/10/2023   Major depressive disorder, recurrent episode (HCC) 03/07/2021   LLQ pain 11/02/2020   Acne    Neck pain 08/17/2019   Reactive lymphadenopathy 05/28/2019   Abnormal thyroid  function test 02/05/2018   Fatigue 02/05/2018    Allergies: No Known Allergies Medications:  Current Outpatient Medications:    azithromycin  (ZITHROMAX ) 250 MG tablet, Take 2 tablets on day 1, then 1 tablet daily on days 2 through 5, Disp: 6 tablet, Rfl: 0   clindamycin  (CLEOCIN  T) 1 % lotion, Apply topically., Disp: , Rfl:    drospirenone -ethinyl estradiol  (YAZ) 3-0.02 MG tablet, Take 1 tablet by mouth daily., Disp: 84 tablet, Rfl: 1   FLUoxetine  (PROZAC ) 20 MG tablet, Take 1 tablet (20 mg total) by mouth daily., Disp: 90 tablet, Rfl: 0  minocycline  (MINOCIN ) 100 MG capsule, Take 1 capsule (100 mg total) by mouth daily. Refills to be taken 1 week as needed with flares as directed., Disp: 30 capsule, Rfl: 3   Norethindrone  Acetate-Ethinyl Estrad-FE (LOESTRIN  24 FE) 1-20 MG-MCG(24) tablet, Take 1 tablet by mouth daily., Disp: 84 tablet, Rfl: 11   omeprazole  (PRILOSEC) 40 MG capsule, Take 1 capsule (40 mg total) by mouth daily., Disp: 90 capsule, Rfl: 3  Observations/Objective: Patient is  well-developed, well-nourished in no acute distress.  Resting comfortably at home.  Head is normocephalic, atraumatic.  No labored breathing.  Speech is clear and coherent with logical content.  Patient is alert and oriented at baseline.    Assessment and Plan: 1. Bronchitis (Primary) - azithromycin  (ZITHROMAX ) 250 MG tablet; Take 2 tablets on day 1, then 1 tablet daily on days 2 through 5  Dispense: 6 tablet; Refill: 0  INSTRUCTIONS: use a humidifier for nasal congestion Drink plenty of fluids, rest and wash hands frequently to avoid the spread of infection Alternate tylenol  and Motrin  for relief of fever   Follow Up Instructions: I discussed the assessment and treatment plan with the patient. The patient was provided an opportunity to ask questions and all were answered. The patient agreed with the plan and demonstrated an understanding of the instructions.  A copy of instructions were sent to the patient via MyChart unless otherwise noted below.    The patient was advised to call back or seek an in-person evaluation if the symptoms worsen or if the condition fails to improve as anticipated.    Christine Touhey W Travas Schexnayder, NP

## 2024-01-17 NOTE — Patient Instructions (Signed)
  Christine Estes, thank you for joining Haze LELON Servant, NP for today's virtual visit.  While this provider is not your primary care provider (PCP), if your PCP is located in our provider database this encounter information will be shared with them immediately following your visit.   A [facility] MyChart account gives you access to today's visit and all your visits, tests, and labs performed at [facility]  click here if you don't have a [facility] MyChart account or go to [email or web address]  Consent: (Patient) Christine Estes provided verbal consent for this virtual visit at the beginning of the encounter.  Current Medications:  Current Outpatient Medications:    azithromycin  (ZITHROMAX ) 250 MG tablet, Take 2 tablets on day 1, then 1 tablet daily on days 2 through 5, Disp: 6 tablet, Rfl: 0   clindamycin  (CLEOCIN  T) 1 % lotion, Apply topically., Disp: , Rfl:    drospirenone -ethinyl estradiol  (YAZ) 3-0.02 MG tablet, Take 1 tablet by mouth daily., Disp: 84 tablet, Rfl: 1   FLUoxetine  (PROZAC ) 20 MG tablet, Take 1 tablet (20 mg total) by mouth daily., Disp: 90 tablet, Rfl: 0   minocycline  (MINOCIN ) 100 MG capsule, Take 1 capsule (100 mg total) by mouth daily. Refills to be taken 1 week as needed with flares as directed., Disp: 30 capsule, Rfl: 3   Norethindrone  Acetate-Ethinyl Estrad-FE (LOESTRIN  24 FE) 1-20 MG-MCG(24) tablet, Take 1 tablet by mouth daily., Disp: 84 tablet, Rfl: 11   omeprazole  (PRILOSEC) 40 MG capsule, Take 1 capsule (40 mg total) by mouth daily., Disp: 90 capsule, Rfl: 3   Medications ordered in this encounter:  Meds ordered this encounter  Medications   azithromycin  (ZITHROMAX ) 250 MG tablet    Sig: Take 2 tablets on day 1, then 1 tablet daily on days 2 through 5    Dispense:  6 tablet    Refill:  0    Supervising Provider:   LAMPTEY, PHILIP O [phone]     *If you need refills on other medications prior to your next appointment, please contact your  pharmacy*  Follow-Up: Call back or seek an in-person evaluation if the symptoms worsen or if the condition fails to improve as anticipated.  [facility] [phone]  Other Instructions INSTRUCTIONS: use a humidifier for nasal congestion Drink plenty of fluids, rest and wash hands frequently to avoid the spread of infection Alternate tylenol  and Motrin  for relief of fever    If you have been instructed to have an in-person evaluation today at a local Urgent Care facility, please use the link below. It will take you to a list of all of our available [facility], including address, phone number and hours of operation. Please do not delay care.  [facility]  If you or a family member do not have a primary care provider, use the link below to schedule a visit and establish care. When you choose a [facility] primary care physician or advanced practice provider, you gain a long-term partner in health. Find a Primary Care Provider  Learn more about [facility]'s in-office and virtual care options:  - Get Care Now

## 2024-01-20 ENCOUNTER — Telehealth: Payer: Self-pay | Admitting: Family Medicine

## 2024-01-20 NOTE — Telephone Encounter (Signed)
 Patient requests TOC from Dr. Wendolyn to Dr. Jodie. Please advise.

## 2024-02-10 DIAGNOSIS — F429 Obsessive-compulsive disorder, unspecified: Secondary | ICD-10-CM | POA: Diagnosis not present

## 2024-03-07 ENCOUNTER — Other Ambulatory Visit (HOSPITAL_COMMUNITY): Payer: Self-pay

## 2024-03-08 ENCOUNTER — Other Ambulatory Visit (HOSPITAL_COMMUNITY): Payer: Self-pay

## 2024-03-09 ENCOUNTER — Other Ambulatory Visit (HOSPITAL_COMMUNITY): Payer: Self-pay

## 2024-03-09 ENCOUNTER — Other Ambulatory Visit: Payer: Self-pay

## 2024-03-09 MED ORDER — FLUOXETINE HCL 20 MG PO TABS
20.0000 mg | ORAL_TABLET | Freq: Every day | ORAL | 0 refills | Status: AC
Start: 2024-03-09 — End: ?
  Filled 2024-03-09: qty 90, 90d supply, fill #0

## 2024-03-18 DIAGNOSIS — F429 Obsessive-compulsive disorder, unspecified: Secondary | ICD-10-CM | POA: Diagnosis not present

## 2024-04-16 DIAGNOSIS — F419 Anxiety disorder, unspecified: Secondary | ICD-10-CM | POA: Diagnosis not present

## 2024-04-16 DIAGNOSIS — F909 Attention-deficit hyperactivity disorder, unspecified type: Secondary | ICD-10-CM | POA: Diagnosis not present

## 2024-05-06 DIAGNOSIS — F429 Obsessive-compulsive disorder, unspecified: Secondary | ICD-10-CM | POA: Diagnosis not present

## 2024-05-24 ENCOUNTER — Other Ambulatory Visit (HOSPITAL_COMMUNITY): Payer: Self-pay

## 2024-06-01 ENCOUNTER — Other Ambulatory Visit (HOSPITAL_COMMUNITY): Payer: Self-pay

## 2024-06-03 ENCOUNTER — Other Ambulatory Visit (HOSPITAL_COMMUNITY): Payer: Self-pay

## 2024-06-03 MED ORDER — FLUOXETINE HCL 20 MG PO TABS
20.0000 mg | ORAL_TABLET | Freq: Every day | ORAL | 0 refills | Status: AC
  Filled 2024-06-03: qty 90, 90d supply, fill #0

## 2024-11-23 ENCOUNTER — Encounter: Admitting: Family Medicine
